# Patient Record
Sex: Female | Born: 1965 | Hispanic: No | Marital: Married | Smoking: Never smoker
Health system: Southern US, Community
[De-identification: ages and names within clinical notes are randomized; demographics above are authoritative.]

## PROBLEM LIST (undated history)

## (undated) DIAGNOSIS — D259 Leiomyoma of uterus, unspecified: Secondary | ICD-10-CM

## (undated) DIAGNOSIS — F32A Depression, unspecified: Secondary | ICD-10-CM

## (undated) DIAGNOSIS — G43909 Migraine, unspecified, not intractable, without status migrainosus: Secondary | ICD-10-CM

## (undated) DIAGNOSIS — J342 Deviated nasal septum: Secondary | ICD-10-CM

## (undated) DIAGNOSIS — C801 Malignant (primary) neoplasm, unspecified: Secondary | ICD-10-CM

## (undated) DIAGNOSIS — K219 Gastro-esophageal reflux disease without esophagitis: Secondary | ICD-10-CM

## (undated) DIAGNOSIS — F419 Anxiety disorder, unspecified: Secondary | ICD-10-CM

## (undated) DIAGNOSIS — N83209 Unspecified ovarian cyst, unspecified side: Secondary | ICD-10-CM

## (undated) DIAGNOSIS — I10 Essential (primary) hypertension: Secondary | ICD-10-CM

## (undated) DIAGNOSIS — E079 Disorder of thyroid, unspecified: Secondary | ICD-10-CM

## (undated) DIAGNOSIS — F329 Major depressive disorder, single episode, unspecified: Secondary | ICD-10-CM

## (undated) DIAGNOSIS — Z8619 Personal history of other infectious and parasitic diseases: Secondary | ICD-10-CM

## (undated) HISTORY — DX: Migraine, unspecified, not intractable, without status migrainosus: G43.909

## (undated) HISTORY — DX: Personal history of other infectious and parasitic diseases: Z86.19

## (undated) HISTORY — DX: Malignant (primary) neoplasm, unspecified: C80.1

## (undated) HISTORY — DX: Anxiety disorder, unspecified: F41.9

## (undated) HISTORY — DX: Essential (primary) hypertension: I10

## (undated) HISTORY — DX: Gastro-esophageal reflux disease without esophagitis: K21.9

## (undated) HISTORY — DX: Leiomyoma of uterus, unspecified: D25.9

## (undated) HISTORY — DX: Depression, unspecified: F32.A

## (undated) HISTORY — DX: Deviated nasal septum: J34.2

## (undated) HISTORY — DX: Disorder of thyroid, unspecified: E07.9

## (undated) HISTORY — DX: Unspecified ovarian cyst, unspecified side: N83.209

---
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
## 2007-09-26 HISTORY — PX: OTHER SURGICAL HISTORY: SHX169

## 2008-09-25 HISTORY — PX: THYROIDECTOMY: SHX17

## 2009-09-25 HISTORY — PX: OTHER SURGICAL HISTORY: SHX169

## 2017-12-24 LAB — HM COLONOSCOPY

## 2018-02-05 DIAGNOSIS — F411 Generalized anxiety disorder: Secondary | ICD-10-CM | POA: Diagnosis not present

## 2018-02-27 DIAGNOSIS — E039 Hypothyroidism, unspecified: Secondary | ICD-10-CM | POA: Diagnosis not present

## 2018-02-27 DIAGNOSIS — G43909 Migraine, unspecified, not intractable, without status migrainosus: Secondary | ICD-10-CM | POA: Diagnosis not present

## 2018-02-27 DIAGNOSIS — F329 Major depressive disorder, single episode, unspecified: Secondary | ICD-10-CM | POA: Diagnosis not present

## 2018-02-27 DIAGNOSIS — C3411 Malignant neoplasm of upper lobe, right bronchus or lung: Secondary | ICD-10-CM | POA: Diagnosis not present

## 2018-03-12 DIAGNOSIS — F411 Generalized anxiety disorder: Secondary | ICD-10-CM | POA: Diagnosis not present

## 2018-05-24 DIAGNOSIS — H9202 Otalgia, left ear: Secondary | ICD-10-CM | POA: Diagnosis not present

## 2018-05-24 DIAGNOSIS — R1310 Dysphagia, unspecified: Secondary | ICD-10-CM | POA: Diagnosis not present

## 2018-05-24 DIAGNOSIS — C3490 Malignant neoplasm of unspecified part of unspecified bronchus or lung: Secondary | ICD-10-CM | POA: Diagnosis not present

## 2018-05-24 DIAGNOSIS — Z85850 Personal history of malignant neoplasm of thyroid: Secondary | ICD-10-CM | POA: Diagnosis not present

## 2018-06-24 DIAGNOSIS — H9202 Otalgia, left ear: Secondary | ICD-10-CM | POA: Diagnosis not present

## 2018-08-06 DIAGNOSIS — Z85850 Personal history of malignant neoplasm of thyroid: Secondary | ICD-10-CM | POA: Diagnosis not present

## 2018-08-06 DIAGNOSIS — E890 Postprocedural hypothyroidism: Secondary | ICD-10-CM | POA: Diagnosis not present

## 2018-08-15 DIAGNOSIS — F4312 Post-traumatic stress disorder, chronic: Secondary | ICD-10-CM | POA: Diagnosis not present

## 2018-09-06 DIAGNOSIS — F4312 Post-traumatic stress disorder, chronic: Secondary | ICD-10-CM | POA: Diagnosis not present

## 2018-09-13 DIAGNOSIS — R911 Solitary pulmonary nodule: Secondary | ICD-10-CM | POA: Diagnosis not present

## 2018-10-04 DIAGNOSIS — F4312 Post-traumatic stress disorder, chronic: Secondary | ICD-10-CM | POA: Diagnosis not present

## 2018-10-09 DIAGNOSIS — E890 Postprocedural hypothyroidism: Secondary | ICD-10-CM | POA: Diagnosis not present

## 2018-10-09 DIAGNOSIS — Z2821 Immunization not carried out because of patient refusal: Secondary | ICD-10-CM | POA: Diagnosis not present

## 2018-10-09 DIAGNOSIS — D497 Neoplasm of unspecified behavior of endocrine glands and other parts of nervous system: Secondary | ICD-10-CM | POA: Diagnosis not present

## 2018-10-09 DIAGNOSIS — Z85850 Personal history of malignant neoplasm of thyroid: Secondary | ICD-10-CM | POA: Diagnosis not present

## 2018-10-11 DIAGNOSIS — F4312 Post-traumatic stress disorder, chronic: Secondary | ICD-10-CM | POA: Diagnosis not present

## 2018-10-15 DIAGNOSIS — Z0000 Encounter for general adult medical examination without abnormal findings: Secondary | ICD-10-CM | POA: Diagnosis not present

## 2018-10-25 DIAGNOSIS — F4312 Post-traumatic stress disorder, chronic: Secondary | ICD-10-CM | POA: Diagnosis not present

## 2018-11-04 DIAGNOSIS — F4312 Post-traumatic stress disorder, chronic: Secondary | ICD-10-CM | POA: Diagnosis not present

## 2018-11-11 DIAGNOSIS — Z85850 Personal history of malignant neoplasm of thyroid: Secondary | ICD-10-CM | POA: Diagnosis not present

## 2018-11-15 DIAGNOSIS — F4312 Post-traumatic stress disorder, chronic: Secondary | ICD-10-CM | POA: Diagnosis not present

## 2018-11-29 DIAGNOSIS — F4312 Post-traumatic stress disorder, chronic: Secondary | ICD-10-CM | POA: Diagnosis not present

## 2018-12-16 DIAGNOSIS — F4312 Post-traumatic stress disorder, chronic: Secondary | ICD-10-CM | POA: Diagnosis not present

## 2018-12-30 DIAGNOSIS — F4312 Post-traumatic stress disorder, chronic: Secondary | ICD-10-CM | POA: Diagnosis not present

## 2019-01-13 DIAGNOSIS — F4312 Post-traumatic stress disorder, chronic: Secondary | ICD-10-CM | POA: Diagnosis not present

## 2019-02-12 DIAGNOSIS — R197 Diarrhea, unspecified: Secondary | ICD-10-CM | POA: Diagnosis not present

## 2019-02-12 DIAGNOSIS — R1084 Generalized abdominal pain: Secondary | ICD-10-CM | POA: Diagnosis not present

## 2019-02-12 DIAGNOSIS — R112 Nausea with vomiting, unspecified: Secondary | ICD-10-CM | POA: Diagnosis not present

## 2019-02-12 DIAGNOSIS — Z630 Problems in relationship with spouse or partner: Secondary | ICD-10-CM | POA: Diagnosis not present

## 2019-02-21 DIAGNOSIS — E039 Hypothyroidism, unspecified: Secondary | ICD-10-CM | POA: Diagnosis not present

## 2019-02-21 DIAGNOSIS — E785 Hyperlipidemia, unspecified: Secondary | ICD-10-CM | POA: Diagnosis not present

## 2019-02-21 DIAGNOSIS — E559 Vitamin D deficiency, unspecified: Secondary | ICD-10-CM | POA: Diagnosis not present

## 2019-02-21 DIAGNOSIS — R740 Nonspecific elevation of levels of transaminase and lactic acid dehydrogenase [LDH]: Secondary | ICD-10-CM | POA: Diagnosis not present

## 2019-02-21 DIAGNOSIS — Z79899 Other long term (current) drug therapy: Secondary | ICD-10-CM | POA: Diagnosis not present

## 2019-02-21 DIAGNOSIS — R799 Abnormal finding of blood chemistry, unspecified: Secondary | ICD-10-CM | POA: Diagnosis not present

## 2019-04-04 DIAGNOSIS — R911 Solitary pulmonary nodule: Secondary | ICD-10-CM | POA: Diagnosis not present

## 2019-04-10 DIAGNOSIS — Z20828 Contact with and (suspected) exposure to other viral communicable diseases: Secondary | ICD-10-CM | POA: Diagnosis not present

## 2019-04-10 DIAGNOSIS — R05 Cough: Secondary | ICD-10-CM | POA: Diagnosis not present

## 2019-04-10 DIAGNOSIS — R0600 Dyspnea, unspecified: Secondary | ICD-10-CM | POA: Diagnosis not present

## 2019-04-10 DIAGNOSIS — Z87898 Personal history of other specified conditions: Secondary | ICD-10-CM | POA: Diagnosis not present

## 2019-05-12 DIAGNOSIS — M9903 Segmental and somatic dysfunction of lumbar region: Secondary | ICD-10-CM | POA: Diagnosis not present

## 2019-05-12 DIAGNOSIS — M545 Low back pain: Secondary | ICD-10-CM | POA: Diagnosis not present

## 2019-05-12 DIAGNOSIS — M9902 Segmental and somatic dysfunction of thoracic region: Secondary | ICD-10-CM | POA: Diagnosis not present

## 2019-05-12 DIAGNOSIS — M546 Pain in thoracic spine: Secondary | ICD-10-CM | POA: Diagnosis not present

## 2019-07-16 DIAGNOSIS — G43909 Migraine, unspecified, not intractable, without status migrainosus: Secondary | ICD-10-CM | POA: Diagnosis not present

## 2019-07-16 DIAGNOSIS — R1110 Vomiting, unspecified: Secondary | ICD-10-CM | POA: Diagnosis not present

## 2019-07-16 DIAGNOSIS — F17200 Nicotine dependence, unspecified, uncomplicated: Secondary | ICD-10-CM | POA: Diagnosis not present

## 2019-07-16 DIAGNOSIS — Z885 Allergy status to narcotic agent status: Secondary | ICD-10-CM | POA: Diagnosis not present

## 2019-07-16 DIAGNOSIS — Z79899 Other long term (current) drug therapy: Secondary | ICD-10-CM | POA: Diagnosis not present

## 2019-07-16 DIAGNOSIS — R197 Diarrhea, unspecified: Secondary | ICD-10-CM | POA: Diagnosis not present

## 2019-07-17 DIAGNOSIS — F419 Anxiety disorder, unspecified: Secondary | ICD-10-CM | POA: Diagnosis not present

## 2019-07-17 DIAGNOSIS — G43909 Migraine, unspecified, not intractable, without status migrainosus: Secondary | ICD-10-CM | POA: Diagnosis not present

## 2019-07-17 DIAGNOSIS — E039 Hypothyroidism, unspecified: Secondary | ICD-10-CM | POA: Diagnosis not present

## 2019-07-17 DIAGNOSIS — Z85118 Personal history of other malignant neoplasm of bronchus and lung: Secondary | ICD-10-CM | POA: Diagnosis not present

## 2019-07-31 DIAGNOSIS — M9901 Segmental and somatic dysfunction of cervical region: Secondary | ICD-10-CM | POA: Diagnosis not present

## 2019-07-31 DIAGNOSIS — M542 Cervicalgia: Secondary | ICD-10-CM | POA: Diagnosis not present

## 2019-07-31 DIAGNOSIS — M9902 Segmental and somatic dysfunction of thoracic region: Secondary | ICD-10-CM | POA: Diagnosis not present

## 2019-07-31 DIAGNOSIS — M546 Pain in thoracic spine: Secondary | ICD-10-CM | POA: Diagnosis not present

## 2019-09-29 DIAGNOSIS — F4312 Post-traumatic stress disorder, chronic: Secondary | ICD-10-CM | POA: Diagnosis not present

## 2019-10-06 DIAGNOSIS — R911 Solitary pulmonary nodule: Secondary | ICD-10-CM | POA: Diagnosis not present

## 2019-10-06 DIAGNOSIS — C73 Malignant neoplasm of thyroid gland: Secondary | ICD-10-CM | POA: Diagnosis not present

## 2019-10-06 DIAGNOSIS — Z7689 Persons encountering health services in other specified circumstances: Secondary | ICD-10-CM | POA: Diagnosis not present

## 2019-10-06 DIAGNOSIS — C3491 Malignant neoplasm of unspecified part of right bronchus or lung: Secondary | ICD-10-CM | POA: Diagnosis not present

## 2019-10-13 DIAGNOSIS — C3491 Malignant neoplasm of unspecified part of right bronchus or lung: Secondary | ICD-10-CM | POA: Insufficient documentation

## 2019-10-13 DIAGNOSIS — F4312 Post-traumatic stress disorder, chronic: Secondary | ICD-10-CM | POA: Diagnosis not present

## 2019-10-17 DIAGNOSIS — M9902 Segmental and somatic dysfunction of thoracic region: Secondary | ICD-10-CM | POA: Diagnosis not present

## 2019-10-17 DIAGNOSIS — M9901 Segmental and somatic dysfunction of cervical region: Secondary | ICD-10-CM | POA: Diagnosis not present

## 2019-10-17 DIAGNOSIS — M546 Pain in thoracic spine: Secondary | ICD-10-CM | POA: Diagnosis not present

## 2019-10-17 DIAGNOSIS — M542 Cervicalgia: Secondary | ICD-10-CM | POA: Diagnosis not present

## 2019-10-21 DIAGNOSIS — F4312 Post-traumatic stress disorder, chronic: Secondary | ICD-10-CM | POA: Diagnosis not present

## 2019-10-23 DIAGNOSIS — M9902 Segmental and somatic dysfunction of thoracic region: Secondary | ICD-10-CM | POA: Diagnosis not present

## 2019-10-23 DIAGNOSIS — M9901 Segmental and somatic dysfunction of cervical region: Secondary | ICD-10-CM | POA: Diagnosis not present

## 2019-10-23 DIAGNOSIS — M542 Cervicalgia: Secondary | ICD-10-CM | POA: Diagnosis not present

## 2019-10-23 DIAGNOSIS — M546 Pain in thoracic spine: Secondary | ICD-10-CM | POA: Diagnosis not present

## 2019-11-04 DIAGNOSIS — F4312 Post-traumatic stress disorder, chronic: Secondary | ICD-10-CM | POA: Diagnosis not present

## 2019-11-25 DIAGNOSIS — F4312 Post-traumatic stress disorder, chronic: Secondary | ICD-10-CM | POA: Diagnosis not present

## 2019-12-10 DIAGNOSIS — F4312 Post-traumatic stress disorder, chronic: Secondary | ICD-10-CM | POA: Diagnosis not present

## 2019-12-29 ENCOUNTER — Other Ambulatory Visit: Payer: Self-pay | Admitting: Internal Medicine

## 2019-12-29 ENCOUNTER — Encounter: Payer: Self-pay | Admitting: Internal Medicine

## 2019-12-29 ENCOUNTER — Other Ambulatory Visit: Payer: Self-pay

## 2019-12-29 ENCOUNTER — Ambulatory Visit: Payer: BC Managed Care – PPO | Admitting: Internal Medicine

## 2019-12-29 VITALS — BP 108/70 | HR 94 | Temp 98.1°F | Ht 68.5 in | Wt 151.0 lb

## 2019-12-29 DIAGNOSIS — N83201 Unspecified ovarian cyst, right side: Secondary | ICD-10-CM

## 2019-12-29 DIAGNOSIS — J342 Deviated nasal septum: Secondary | ICD-10-CM

## 2019-12-29 DIAGNOSIS — F321 Major depressive disorder, single episode, moderate: Secondary | ICD-10-CM | POA: Diagnosis not present

## 2019-12-29 DIAGNOSIS — G43009 Migraine without aura, not intractable, without status migrainosus: Secondary | ICD-10-CM | POA: Diagnosis not present

## 2019-12-29 DIAGNOSIS — Z85850 Personal history of malignant neoplasm of thyroid: Secondary | ICD-10-CM | POA: Diagnosis not present

## 2019-12-29 DIAGNOSIS — D259 Leiomyoma of uterus, unspecified: Secondary | ICD-10-CM

## 2019-12-29 DIAGNOSIS — F419 Anxiety disorder, unspecified: Secondary | ICD-10-CM | POA: Insufficient documentation

## 2019-12-29 DIAGNOSIS — M5020 Other cervical disc displacement, unspecified cervical region: Secondary | ICD-10-CM | POA: Insufficient documentation

## 2019-12-29 DIAGNOSIS — E559 Vitamin D deficiency, unspecified: Secondary | ICD-10-CM | POA: Diagnosis not present

## 2019-12-29 DIAGNOSIS — Z23 Encounter for immunization: Secondary | ICD-10-CM

## 2019-12-29 DIAGNOSIS — Z1231 Encounter for screening mammogram for malignant neoplasm of breast: Secondary | ICD-10-CM

## 2019-12-29 DIAGNOSIS — K760 Fatty (change of) liver, not elsewhere classified: Secondary | ICD-10-CM | POA: Diagnosis not present

## 2019-12-29 DIAGNOSIS — C3491 Malignant neoplasm of unspecified part of right bronchus or lung: Secondary | ICD-10-CM

## 2019-12-29 MED ORDER — LEVOTHYROXINE SODIUM 100 MCG PO TABS: 100.0000 ug | ORAL_TABLET | Freq: Every day | ORAL | 0 refills | Status: DC

## 2019-12-29 MED ORDER — FLUOXETINE HCL 20 MG PO CAPS: 20.0000 mg | ORAL_CAPSULE | Freq: Every day | ORAL | 0 refills | Status: DC

## 2019-12-29 NOTE — Patient Instructions (Signed)
Go to the imaging department to sign for your mammogram records from [place] - they will need these before you can schedule a mammogram

## 2019-12-29 NOTE — Progress Notes (Signed)
Date:  12/29/2019   Name:  Emma Ayers   DOB:  02-17-1966   MRN:  [id number]   Chief Complaint: Establish Care  Thyroid Problem Presents for follow-up (hx thyroid cancer s/p thyroidectomy and RAI tx -) visit. Symptoms include anxiety and fatigue. Patient reports no constipation, diarrhea, heat intolerance, leg swelling, menstrual problem, palpitations or weight gain. The symptoms have been stable.  Migraine  This is a recurrent problem. The problem occurs intermittently. The pain quality is similar to prior headaches. Associated symptoms include back pain, coughing (with yellow phlegm about once a day), insomnia and neck pain. Pertinent negatives include no dizziness or hearing loss. She has tried triptans for the symptoms. The treatment provided significant relief.  Depression        This is a chronic problem.  The problem occurs daily.  Associated symptoms include decreased concentration, fatigue, helplessness, hopelessness, insomnia, irritable, restlessness, decreased interest, appetite change, sad and suicidal ideas.  Associated symptoms include no headaches.  Past treatments include SSRIs - Selective serotonin reuptake inhibitors.  Compliance with treatment is good.  Previous treatment provided mild relief.  Past medical history includes thyroid problem.   Tobacco use - she smokes despite having been treated for presumed lung cancer in 2019.  She previously was prescribed medical marijuana in [place] but can not get that here. She has an intermittent cough with yellow phlegm about once a day.  No wheezing or SOB. GYN concerns - she has hx of uterine fibroid and right ovarian cyst.  She underwent uterine ablation around 2011.  Apparently the ovarian cyst is benign but she is due for GYN follow up. Cervical HNP and other spinal injury - from at [facility] in 2009.  She has been able to rehab with the assistance of TENS, traction, etc.  She has had ESI in her neck but no other spinal sites.  She feels  like she has regained about 90% of function. Vitamin D and CoQ-10 deficiencies - she has been tested in the past few years and was low.  She has been taking supplements regularly and would like to have her levels checked. Fibrocystic breast - she is due for a screening mammogram.  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  Instructions are provided. Shingrix - pt reports that she obtained the first Shingrix from the pharmacy.  She would like to get her second dose today before the dosing interval is exceeded. Hepatic steatosis - this has been documented on prior scans.  She does not consume alcohol but admits that she may eat more sweets than she should.  She has not had labs done recently for liver function tests.    Review of Systems  Constitutional: Positive for appetite change and fatigue. Negative for weight gain.  HENT: Positive for congestion (due to septal deviation) and postnasal drip. Negative for hearing loss, trouble swallowing and voice change.   Respiratory: Positive for cough (with yellow phlegm about once a day). Negative for shortness of breath and wheezing.   Cardiovascular: Negative for chest pain, palpitations and leg swelling.  Gastrointestinal: Negative for constipation and diarrhea.  Endocrine: Negative for heat intolerance.  Genitourinary: Negative for dysuria, hematuria, menstrual problem and pelvic pain.  Musculoskeletal: Positive for back pain and neck pain. Negative for gait problem.  Allergic/Immunologic: Positive for environmental allergies.  Neurological: Negative for dizziness, light-headedness and headaches.  Psychiatric/Behavioral: Positive for decreased concentration, depression and suicidal ideas. The patient is  nervous/anxious and has insomnia.     Patient Active Problem List   Diagnosis Date Noted  . History of thyroid cancer 12/29/2019  . Anxiety disorder 12/29/2019  . Lung nodule 12/29/2019  . Migraine without  aura, not intractable 12/29/2019  . Adenocarcinoma of right lung ([place]) 10/13/2019    Allergies  Allergen Reactions  . Bee Venom Anaphylaxis  . Benzocaine Nausea And Vomiting  . Codeine Nausea Only  . Latex Rash    Past Surgical History:  Procedure Laterality Date  . iv radiation  2018-2019  . radioactive iodine    . 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    Social History   Tobacco Use  . Smoking status: Never Smoker  . Smokeless tobacco: Never Used  Substance Use Topics  . Alcohol use: Yes    Alcohol/week: 1.0 standard drinks    Types: 1 Standard drinks or equivalent per week  . Drug use: Yes    Types: Marijuana     Medication list has been reviewed and updated.  Current Meds  Medication Sig  . Cholecalciferol 50 MCG (2000 UT) TABS Take by mouth.  Marland Kitchen FLUoxetine (PROZAC) 20 MG capsule Take by mouth.  . levothyroxine (SYNTHROID) 100 MCG tablet Take 100 mcg by mouth daily before breakfast. Unithyroid brand ONLY  . LORazepam (ATIVAN) 1 MG tablet Take 1 mg by mouth every 8 (eight) hours. 0.5-1 tablets as needed for anxiety  . Multiple Vitamin (MULTIVITAMIN) capsule Take 1 capsule by mouth daily.  . ondansetron (ZOFRAN) 8 MG tablet Take 4 mg by mouth every 8 (eight) hours as needed for nausea or vomiting.   . SUMAtriptan (IMITREX) 6 MG/0.5ML SOLN injection Inject 6 mg into the skin every 2 (two) hours as needed for migraine or headache. May repeat in 2 hours if headache persists or recurs.    PHQ 2/9 Scores 12/29/2019  PHQ - 2 Score 4  PHQ- 9 Score 18   GAD 7 : Generalized Anxiety Score 12/29/2019  Nervous, Anxious, on Edge 3  Control/stop worrying 2  Worry too much - different things 2  Trouble relaxing 3  Restless 2  Easily annoyed or irritable 2  Afraid - awful might happen 2  Total GAD 7 Score 16  Anxiety Difficulty Extremely difficult       BP Readings from Last 3 Encounters:  12/29/19 108/70    Physical Exam Vitals and nursing note reviewed.    Constitutional:      General: She is irritable. She is not in acute distress.    Appearance: Normal appearance. She is well-developed.  HENT:     Head: Normocephalic and atraumatic.  Neck:     Vascular: No carotid bruit.  Cardiovascular:     Rate and Rhythm: Normal rate and regular rhythm.     Pulses: Normal pulses.  Pulmonary:     Effort: Pulmonary effort is normal. No respiratory distress.     Breath sounds: No wheezing or rhonchi.  Musculoskeletal:     Cervical back: Normal range of motion. No tenderness.     Right lower leg: No edema.     Left lower leg: No edema.  Lymphadenopathy:     Cervical: No cervical adenopathy.  Skin:    General: Skin is warm and dry.     Capillary Refill: Capillary refill takes less than 2 seconds.     Findings: No rash.  Neurological:     General: No focal deficit present.     Mental Status: She  is alert and oriented to person, place, and time.  Psychiatric:        Attention and Perception: Attention normal.        Mood and Affect: Mood is depressed. Affect is not tearful or inappropriate.        Speech: Speech normal.        Behavior: Behavior normal.        Thought Content: Thought content does not include suicidal plan.     Wt Readings from Last 3 Encounters:  12/29/19 151 lb (68.5 kg)    BP 108/70   Pulse 94   Temp 98.1 F (36.7 C) (Oral)   Ht 5' 8.5" (1.74 m)   Wt 151 lb (68.5 kg)   SpO2 98%   BMI 22.63 kg/m   Assessment and Plan: 1. History of thyroid cancer Pt is s/p thyroidectomy and RAI She believes that she has had some tissue regrowth and needs referral to both Endocrine and ENT Will check labs today; refill levothyroxine She has also been taking thyroid supplement due to low T3 levels that is no longer on the market - she will discuss alternative with Endo - Ambulatory referral to Endocrinology - levothyroxine (SYNTHROID) 100 MCG tablet; Take 1 tablet (100 mcg total) by mouth daily before breakfast. Unithyroid brand  ONLY  Dispense: 90 tablet; Refill: 0 - TSH+T4F+T3Free - Thyroglobulin Level  2. Depression, major, single episode, moderate ([place]) Clinically very depressed due to her family and social situation.  She was compelled to move to  to be near her daughters who reside with her husband.  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  She has continues to take Prozac but also prefers to include homeopathic and alternative medicine strategies into her treatment plan. Will refill Prozac and refer to Psych - Ambulatory referral to Psychiatry - FLUoxetine (PROZAC) 20 MG capsule; Take 1 capsule (20 mg total) by mouth daily.  Dispense: 90 capsule; Refill: 0  3. Migraine without aura and without status migrainosus, not intractable These are well controlled on Imitrex and Zofran The frequency is affected by stress and also by recent extensive dental work  4. Hepatic steatosis Recommend healthy diet, avoidance of alcohol Consider hepatic elastography if labs are abnormal Would like to get prior records as well - Comprehensive metabolic panel  5. Cervical herniated disc Continue home management with TENS, traction and exercise  6. Uterine leiomyoma, unspecified location Needs to establish with GYN - Ambulatory referral to Obstetrics / Gynecology - CBC with Differential/Platelet  7. Ovarian cyst, right Needs to establish with GYN  8. Vitamin D deficiency Continue supplementation and check levels - VITAMIN D 25 Hydroxy (Vit-D Deficiency, Fractures) - Coenzyme Q10, Total  9. Nasal septal deviation Currently using Flonase? Spray for congestion with good results, however would like ENT to evaluate further - Ambulatory referral to ENT  10. Encounter for screening mammogram for breast cancer She will sign for previous records then schedule at [facility]; Future  11. Need for shingles vaccine Second dose given - Varicella-zoster vaccine  IM  12. Adenocarcinoma of right lung [facility]) She has already established with [facility] Recent scan demonstrated apparent stability of the lung findings She was disappointed that the imaging information was not forwarded to her specialist in [place] Currently asymptomatic other than occasional production of yellow phlegm   Partially dictated using [place]. Any errors are unintentional.  Halina Maidens, MD [facility]  12/29/2019  

## 2019-12-30 DIAGNOSIS — F4312 Post-traumatic stress disorder, chronic: Secondary | ICD-10-CM | POA: Diagnosis not present

## 2020-01-01 ENCOUNTER — Telehealth: Payer: Self-pay | Admitting: Certified Nurse Midwife

## 2020-01-05 LAB — CBC WITH DIFFERENTIAL/PLATELET
Basophils Absolute: 0 10*3/uL (ref 0.0–0.2)
Basos: 0 %
EOS (ABSOLUTE): 0.1 10*3/uL (ref 0.0–0.4)
Eos: 2 %
Hematocrit: 38.8 % (ref 34.0–46.6)
Hemoglobin: 13.4 g/dL (ref 11.1–15.9)
Immature Grans (Abs): 0 10*3/uL (ref 0.0–0.1)
Immature Granulocytes: 0 %
Lymphocytes Absolute: 1.6 10*3/uL (ref 0.7–3.1)
Lymphs: 34 %
MCH: 29.6 pg (ref 26.6–33.0)
MCHC: 34.5 g/dL (ref 31.5–35.7)
MCV: 86 fL (ref 79–97)
Monocytes Absolute: 0.5 10*3/uL (ref 0.1–0.9)
Monocytes: 10 %
Neutrophils Absolute: 2.6 10*3/uL (ref 1.4–7.0)
Neutrophils: 54 %
Platelets: 253 10*3/uL (ref 150–450)
RBC: 4.52 x10E6/uL (ref 3.77–5.28)
RDW: 13.3 % (ref 11.7–15.4)
WBC: 4.8 10*3/uL (ref 3.4–10.8)

## 2020-01-05 LAB — COMPREHENSIVE METABOLIC PANEL
ALT: 17 IU/L (ref 0–32)
AST: 21 IU/L (ref 0–40)
Albumin/Globulin Ratio: 1.8 (ref 1.2–2.2)
Albumin: 4.8 g/dL (ref 3.8–4.9)
Alkaline Phosphatase: 59 IU/L (ref 39–117)
BUN/Creatinine Ratio: 13 (ref 9–23)
BUN: 11 mg/dL (ref 6–24)
Bilirubin Total: 0.5 mg/dL (ref 0.0–1.2)
CO2: 24 mmol/L (ref 20–29)
Calcium: 9.6 mg/dL (ref 8.7–10.2)
Chloride: 103 mmol/L (ref 96–106)
Creatinine, Ser: 0.85 mg/dL (ref 0.57–1.00)
GFR calc Af Amer: 90 mL/min/{1.73_m2} (ref >59)
GFR calc non Af Amer: 78 mL/min/{1.73_m2} (ref >59)
Globulin, Total: 2.6 g/dL (ref 1.5–4.5)
Glucose: 89 mg/dL (ref 65–99)
Potassium: 4.1 mmol/L (ref 3.5–5.2)
Sodium: 142 mmol/L (ref 134–144)
Total Protein: 7.4 g/dL (ref 6.0–8.5)

## 2020-01-05 LAB — TSH+T4F+T3FREE
Free T4: 1.49 ng/dL (ref 0.82–1.77)
T3, Free: 2.3 pg/mL (ref 2.0–4.4)
TSH: 4.77 u[IU]/mL — ABNORMAL HIGH (ref 0.450–4.500)

## 2020-01-05 LAB — COENZYME Q10, TOTAL: Coenzyme Q10, Total: 0.75 ug/mL (ref 0.37–2.20)

## 2020-01-05 LAB — THYROGLOBULIN LEVEL: Thyroglobulin (TG-RIA): <2 ng/mL

## 2020-01-05 LAB — VITAMIN D 25 HYDROXY (VIT D DEFICIENCY, FRACTURES): Vit D, 25-Hydroxy: 45.2 ng/mL (ref 30.0–100.0)

## 2020-01-06 ENCOUNTER — Encounter: Payer: Self-pay | Admitting: Internal Medicine

## 2020-01-20 ENCOUNTER — Ambulatory Visit
Admission: RE | Admit: 2020-01-20 | Discharge: 2020-01-20 | Disposition: A | Discharge status: Home / Self Care | Payer: BC Managed Care – PPO | Source: Ambulatory Visit | Attending: Internal Medicine | Admitting: Internal Medicine

## 2020-01-20 DIAGNOSIS — F4312 Post-traumatic stress disorder, chronic: Secondary | ICD-10-CM | POA: Diagnosis not present

## 2020-01-20 DIAGNOSIS — Z1231 Encounter for screening mammogram for malignant neoplasm of breast: Secondary | ICD-10-CM | POA: Diagnosis not present

## 2020-01-27 ENCOUNTER — Other Ambulatory Visit: Payer: Self-pay

## 2020-01-27 ENCOUNTER — Other Ambulatory Visit (HOSPITAL_COMMUNITY)
Admission: RE | Admit: 2020-01-27 | Discharge: 2020-01-27 | Disposition: A | Discharge status: Home / Self Care | Payer: BC Managed Care – PPO | Source: Ambulatory Visit | Attending: Certified Nurse Midwife | Admitting: Certified Nurse Midwife

## 2020-01-27 ENCOUNTER — Encounter: Payer: Self-pay | Admitting: Certified Nurse Midwife

## 2020-01-27 ENCOUNTER — Ambulatory Visit: Payer: BC Managed Care – PPO | Admitting: Certified Nurse Midwife

## 2020-01-27 VITALS — BP 98/61 | HR 60 | Ht 68.0 in | Wt 156.2 lb

## 2020-01-27 DIAGNOSIS — Z23 Encounter for immunization: Secondary | ICD-10-CM | POA: Diagnosis not present

## 2020-01-27 DIAGNOSIS — Z136 Encounter for screening for cardiovascular disorders: Secondary | ICD-10-CM | POA: Diagnosis not present

## 2020-01-27 DIAGNOSIS — Z124 Encounter for screening for malignant neoplasm of cervix: Secondary | ICD-10-CM

## 2020-01-27 DIAGNOSIS — Z01419 Encounter for gynecological examination (general) (routine) without abnormal findings: Secondary | ICD-10-CM | POA: Diagnosis not present

## 2020-01-27 DIAGNOSIS — Z13220 Encounter for screening for lipoid disorders: Secondary | ICD-10-CM

## 2020-01-27 MED ORDER — TETANUS-DIPHTH-ACELL PERTUSSIS 5-2.5-18.5 LF-MCG/0.5 IM SUSP
0.5000 mL | Freq: Once | INTRAMUSCULAR | Status: AC
  Administered 2020-01-27: 0.5 mL via INTRAMUSCULAR

## 2020-01-27 NOTE — Progress Notes (Signed)
GYNECOLOGY ANNUAL PREVENTATIVE CARE ENCOUNTER NOTE  History:     Emma Ayers is a 54 y.o. G8P2002 female here for a routine annual gynecologic exam.  Current complaints: none.   Denies abnormal vaginal bleeding, discharge, pelvic pain, problems with intercourse or other gynecologic concerns.     Social separated with 2 children  Living: alone Work: 2 days a week message therapist  Exercise: daily 30- 1 hr Drug/alcohol/smoke: MJ , occasional alcohol, no smoking   Gynecologic History No LMP recorded. Patient has had an ablation. Contraception: none and ablation  Last Pap: 5 yrs ago per pt. Results were: normal  Last mammogram: 01/20/20. Results were: normal  Obstetric History OB History  Gravida Para Term Preterm AB Living  2 2 2     2   SAB TAB Ectopic Multiple Live Births          2    # Outcome Date GA Lbr Len/2nd Weight Sex Delivery Anes PTL Lv  2 Term 12/08/04   8 lb 7 oz (3.827 kg) F Vag-Spont  N LIV  1 Term 01/27/99   9 lb 3 oz (4.167 kg) F Vag-Spont  N LIV    Past Medical History:  Diagnosis Date  . Anxiety   . Cancer ([place])    thyroid and lung  . Depression   . Deviated septum   . GERD (gastroesophageal reflux disease)   . H/o Lyme disease    X2  . History of West Nile virus (WNV) infection   . Hypertension   . Migraine   . Ovarian cyst   . Thyroid disease   . Uterine fibroid     Past Surgical History:  Procedure Laterality Date  . iv radiation  2018-2019  . radioactive iodine    . 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 uterine ablation  2011    Current Outpatient Medications on File Prior to Visit  Medication Sig Dispense Refill  . Cholecalciferol 50 MCG (2000 UT) TABS Take by mouth.    . Coenzyme Q10 (COQ10) 400 MG CAPS Take by mouth daily.    Marland Kitchen FLUoxetine (PROZAC) 20 MG capsule Take 1 capsule (20 mg total) by mouth daily. 90 capsule 0  . levothyroxine (SYNTHROID) 100 MCG tablet Take 1 tablet (100 mcg total) by mouth daily  before breakfast. Unithyroid brand ONLY 90 tablet 0  . Multiple Vitamin (MULTIVITAMIN) capsule Take 1 capsule by mouth daily.    . SUMAtriptan (IMITREX) 6 MG/0.5ML SOLN injection Inject 6 mg into the skin every 2 (two) hours as needed for migraine or headache. May repeat in 2 hours if headache persists or recurs.    Marland Kitchen LORazepam (ATIVAN) 1 MG tablet Take 1 mg by mouth every 8 (eight) hours. 0.5-1 tablets as needed for anxiety    . ondansetron (ZOFRAN) 8 MG tablet Take 4 mg by mouth every 8 (eight) hours as needed for nausea or vomiting.      No current facility-administered medications on file prior to visit.    Allergies  Allergen Reactions  . Bee Venom Anaphylaxis  . Benzocaine Nausea And Vomiting  . Codeine Nausea Only  . Latex Rash    Social History:  reports that she has never smoked. She has never used smokeless tobacco. She reports current alcohol use of about 1.0 standard drinks of alcohol per week. She reports current drug use. Drug: Marijuana.  Family History  Problem Relation Age of Onset  . Heart failure Mother   .  Breast cancer Mother 56  . Heart disease Mother   . Depression Mother   . Heart attack Father   . Prostate cancer Father   . Anxiety disorder Father   . Heart disease Father   . Drug abuse Sister   . Colon polyps Sister   . Depression Sister   . Hypothyroidism Sister   . Heart disease Brother   . Depression Brother   . Hypothyroidism Brother   . Breast cancer Maternal Aunt   . Breast cancer Maternal Aunt     The following portions of the patient's history were reviewed and updated as appropriate: allergies, current medications, past family history, past medical history, past social history, past surgical history and problem list.  Review of Systems Pertinent items noted in HPI and remainder of comprehensive ROS otherwise negative.  Physical Exam:  BP 98/61   Pulse 60   Ht 5\' 8"  (1.727 m)   Wt 156 lb 4 oz (70.9 kg)   BMI 23.76 kg/m    CONSTITUTIONAL: Well-developed, well-nourished female in no acute distress.  HENT:  Normocephalic, atraumatic, External right and left ear normal. Oropharynx is clear and moist EYES: Conjunctivae and EOM are normal. Pupils are equal, round, and reactive to light. No scleral icterus.  NECK: Normal range of motion, supple, no masses.  Thyroid absent  SKIN: Skin is warm and dry. No rash noted. Not diaphoretic. No erythema. No pallor. MUSCULOSKELETAL: Normal range of motion. No tenderness.  No cyanosis, clubbing, or edema.  2+ distal pulses. NEUROLOGIC: Alert and oriented to person, place, and time. Normal reflexes, muscle tone coordination.  PSYCHIATRIC: Normal mood and affect. Normal behavior. Normal judgment and thought content. CARDIOVASCULAR: Normal heart rate noted, regular rhythm RESPIRATORY: Clear to auscultation bilaterally. Effort and breath sounds normal, no problems with respiration noted. BREASTS: Symmetric in size. No masses, tenderness, skin changes, nipple drainage, or lymphadenopathy bilaterally. Performed in the presence of a chaperone. ABDOMEN: Soft, no distention noted.  No tenderness, rebound or guarding.  PELVIC: Normal appearing external genitalia and urethral meatus; normal appearing vaginal mucosa and cervix.  No abnormal discharge noted.  Pap smear obtained.  Normal uterine size, no other palpable masses, no uterine or adnexal tenderness.  Performed in the presence of a chaperone.   Assessment and Plan:    1. Need for immunization with diphtheria, tetanus, and poliovirus vaccine   2. Women's annual routine gynecological examination Will follow up results of pap smear and manage accordingly. Mammogram completed Colonoscopy: done 3 yrs ago, WNL  Labs: lipid profile, FSH, Estradial  Orders: none  Referral : none  Routine preventative health maintenance measures emphasized. Please refer to After Visit Summary for other counseling recommendations.      Philip Aspen,  CNM

## 2020-01-27 NOTE — Patient Instructions (Signed)

## 2020-01-28 ENCOUNTER — Telehealth: Payer: Self-pay

## 2020-01-28 LAB — LIPID PANEL
Chol/HDL Ratio: 2.2 ratio (ref 0.0–4.4)
Cholesterol, Total: 156 mg/dL (ref 100–199)
HDL: 71 mg/dL (ref >39)
LDL Chol Calc (NIH): 71 mg/dL (ref 0–99)
Triglycerides: 74 mg/dL (ref 0–149)
VLDL Cholesterol Cal: 14 mg/dL (ref 5–40)

## 2020-01-28 LAB — ESTRADIOL: Estradiol: 19.8 pg/mL

## 2020-01-28 LAB — FOLLICLE STIMULATING HORMONE: FSH: 152 m[IU]/mL

## 2020-01-28 NOTE — Telephone Encounter (Signed)
Voicemail message left for patient- per Philip Aspen CNM labs normal- suggestive of menopause.

## 2020-02-03 ENCOUNTER — Telehealth: Payer: Self-pay

## 2020-02-03 LAB — CYTOLOGY - PAP
Comment: NEGATIVE
Diagnosis: NEGATIVE
High risk HPV: NEGATIVE

## 2020-02-03 NOTE — Telephone Encounter (Signed)
Voicemail message left for patient to call office for negative test results per Philip Aspen CNM

## 2020-02-06 ENCOUNTER — Telehealth: Payer: Self-pay | Admitting: Internal Medicine

## 2020-02-06 NOTE — Telephone Encounter (Signed)
Copied from [place] [id number]. Topic: General - Other >> Feb 06, 2020  9:52 AM Antonieta Iba C wrote: Reason for CRM: pt called in to speak with Dr. Gaspar Cola assistant. Pt wouldn't give me details, she is requesting a call back at: [phone]

## 2020-02-06 NOTE — Telephone Encounter (Signed)
Called and was told by the person that answered the phone that the patient is "busy and she will call back when she can."  Please get more details from pt if able.   CM

## 2020-02-10 DIAGNOSIS — F4312 Post-traumatic stress disorder, chronic: Secondary | ICD-10-CM | POA: Diagnosis not present

## 2020-02-17 DIAGNOSIS — Z85850 Personal history of malignant neoplasm of thyroid: Secondary | ICD-10-CM | POA: Diagnosis not present

## 2020-02-17 DIAGNOSIS — E890 Postprocedural hypothyroidism: Secondary | ICD-10-CM | POA: Diagnosis not present

## 2020-02-25 ENCOUNTER — Telehealth: Payer: BC Managed Care – PPO | Admitting: Internal Medicine

## 2020-02-25 ENCOUNTER — Telehealth: Payer: Self-pay

## 2020-02-25 NOTE — Telephone Encounter (Signed)
Called patient after realizing she was on the schedule for a phone visit. Explained the [facility] made this visit wrong and we do not do migraines over the phone. Explained we only do sick visits with fevers to prevent exposure to other patients.  She stated that she can just speak with Dr Army Melia over the phone today and that she others things she needs to discuss with her- and then she can follow up at a in person visit later.  Explained this is not how things work. Apologized that our new call center made this appt incorrectly, but told her that she needs to be seen this afternoon. Offered her a in person visit this afternoon. She declined saying that "It sounds like this is YOUR problem and not mine."  Told her I'm sorry again but if she does not have a fever then she needs to come into the office to discuss her problems with Dr. Army Melia. She then said it sounds like our protocols are made for [place] and not the patients. She is coming off of a migraine and is upset that Dr. Army Melia is refusing a VV.   She wants to speak to the office manager about this- Gave her office managers name and told her she will call her back this afternoon.  CM  Routing to office manager for review.

## 2020-02-26 ENCOUNTER — Ambulatory Visit (INDEPENDENT_AMBULATORY_CARE_PROVIDER_SITE_OTHER): Payer: BC Managed Care – PPO | Admitting: Internal Medicine

## 2020-02-26 ENCOUNTER — Encounter: Payer: Self-pay | Admitting: Internal Medicine

## 2020-02-26 ENCOUNTER — Other Ambulatory Visit: Payer: Self-pay

## 2020-02-26 VITALS — BP 104/64 | HR 80 | Temp 98.1°F | Ht 68.0 in | Wt 153.0 lb

## 2020-02-26 DIAGNOSIS — M5020 Other cervical disc displacement, unspecified cervical region: Secondary | ICD-10-CM | POA: Diagnosis not present

## 2020-02-26 DIAGNOSIS — F321 Major depressive disorder, single episode, moderate: Secondary | ICD-10-CM

## 2020-02-26 DIAGNOSIS — G43009 Migraine without aura, not intractable, without status migrainosus: Secondary | ICD-10-CM | POA: Diagnosis not present

## 2020-02-26 DIAGNOSIS — W57XXXA Bitten or stung by nonvenomous insect and other nonvenomous arthropods, initial encounter: Secondary | ICD-10-CM

## 2020-02-26 DIAGNOSIS — Z85850 Personal history of malignant neoplasm of thyroid: Secondary | ICD-10-CM

## 2020-02-26 DIAGNOSIS — K760 Fatty (change of) liver, not elsewhere classified: Secondary | ICD-10-CM

## 2020-02-26 DIAGNOSIS — R6889 Other general symptoms and signs: Secondary | ICD-10-CM

## 2020-02-26 MED ORDER — ONDANSETRON HCL 8 MG PO TABS: 4.0000 mg | ORAL_TABLET | Freq: Three times a day (TID) | ORAL | 0 refills | Status: DC | PRN

## 2020-02-26 MED ORDER — LORAZEPAM 1 MG PO TABS: 1.0000 mg | ORAL_TABLET | Freq: Three times a day (TID) | ORAL | 0 refills | Status: DC

## 2020-02-26 NOTE — Progress Notes (Signed)
Date:  02/26/2020   Name:  Emma Ayers   DOB:  1965/09/29   MRN:  [id number]   Chief Complaint: Migraine (X2 days, virtigo, nasuea, pain going up the side of head ), Tick Removal (x2-3 weeks 2 tick bites, one right groin around and back of right knee, embedded in skin dont know how long they been there. she thinks she got then off within the first 24 hours. one was full of blood ), and Hypertension (X2-3 months trending high bp, bring it down with breathing, says she can hear a "pulse in her head")  Migraine  This is a recurrent problem. The problem has been unchanged. The pain does not radiate. Associated symptoms include dizziness, nausea and neck pain. Pertinent negatives include no abdominal pain, coughing, fever or vomiting. She has tried triptans for the symptoms.  She keeps a HA diary - has only had 2 migraines this year, the second one was earlier this week.  She generally had vertigo and nausea and they are triggered by stress, chronic neck pain and right sided muscle spasm.  She takes lorazepam to induce sleep and zofran for nausea.  She is concerned that any given HA might escalate with a requirement to go to the ED which she wants to avoid.  She has consulted Neuro in the past and they recommended Topamax if migraines were increasing.  For now , she is content to continue her current regimen and to only use imitrex injectable for very severe headaches.  Tick bites - had 2 tick bites in the past 2 weeks.  One on right knee and in the groin.  She never developed sx of tick fever - no rash, fever, persistent headache, target lesion.  She reports having had Lyme disease twice in the past.  She is familiar with the symptoms and treated herself conservatively with herbal agents.  The tick bites have essentially resolved.  Cervical HNP - she has had MRI documentation of the disc disease but has avoided surgery. She has traction and TENS units at home.  In the past, did well with PT manual  work and massage.  She would like to establish with a PT locally but does not feel that she needs Orthopedics or Neurosurgery consultation at this time.  Depression - she continues to take Prozac.  She was referred to Psych but they required $200 up front and would not take it at the time of her appointment.  She decided not to be seen there.  She relates that overall she is better - her main stress being the situation with her husband and that has improved now that she has a Chief Executive Officer.  She wants to defer psychiatry evaluation at this time and continue to see her counselor, along with Prozac.  Lab Results  Component Value Date   CREATININE 0.85 12/29/2019   BUN 11 12/29/2019   NA 142 12/29/2019   K 4.1 12/29/2019   CL 103 12/29/2019   CO2 24 12/29/2019   Lab Results  Component Value Date   CHOL 156 01/27/2020   HDL 71 01/27/2020   LDLCALC 71 01/27/2020   TRIG 74 01/27/2020   CHOLHDL 2.2 01/27/2020   Lab Results  Component Value Date   TSH 4.770 (H) 12/29/2019   No results found for: HGBA1C Lab Results  Component Value Date   WBC 4.8 12/29/2019   HGB 13.4 12/29/2019   HCT 38.8 12/29/2019   MCV 86 12/29/2019   PLT 253 12/29/2019  Lab Results  Component Value Date   ALT 17 12/29/2019   AST 21 12/29/2019   ALKPHOS 59 12/29/2019   BILITOT 0.5 12/29/2019     Review of Systems  Constitutional: Negative for chills, fatigue and fever.  Respiratory: Negative for cough, chest tightness and shortness of breath.   Cardiovascular: Negative for chest pain, palpitations and leg swelling.       Some concern that her BP is higher - using a home wrist cuff readings often 135/95 in the AM and lower in the PM  Gastrointestinal: Positive for nausea. Negative for abdominal pain, constipation and vomiting.  Musculoskeletal: Positive for arthralgias, neck pain and neck stiffness.  Neurological: Positive for dizziness and headaches.  Psychiatric/Behavioral: Negative for dysphoric mood and  sleep disturbance. The patient is not nervous/anxious.     Patient Active Problem List   Diagnosis Date Noted  . History of thyroid cancer 12/29/2019  . Anxiety disorder 12/29/2019  . Migraine without aura, not intractable 12/29/2019  . Cervical herniated disc 12/29/2019  . Uterine fibroid 12/29/2019  . Depression, major, single episode, moderate ([place]) 12/29/2019  . Vitamin D deficiency 12/29/2019  . Nasal septal deviation 12/29/2019  . Hepatic steatosis 12/29/2019  . Ovarian cyst, right 12/29/2019  . Adenocarcinoma of right lung ([place]) 10/13/2019    Allergies  Allergen Reactions  . Bee Venom Anaphylaxis  . Benzocaine Nausea And Vomiting  . Codeine Nausea Only  . Latex Rash    Past Surgical History:  Procedure Laterality Date  . iv radiation  2018-2019  . radioactive iodine    . 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 uterine ablation  2011    Social History   Tobacco Use  . Smoking status: Never Smoker  . Smokeless tobacco: Never Used  Substance Use Topics  . Alcohol use: Yes    Alcohol/week: 1.0 standard drinks    Types: 1 Standard drinks or equivalent per week  . Drug use: Yes    Frequency: 7.0 times per week    Types: Marijuana    Comment: sometimes at bedtime     Medication list has been reviewed and updated.  Current Meds  Medication Sig  . Cholecalciferol 50 MCG (2000 UT) TABS Take by mouth. As needed  . Coenzyme Q10 (COQ10) 400 MG CAPS Take by mouth daily. As needed  . FLUoxetine (PROZAC) 20 MG capsule Take 1 capsule (20 mg total) by mouth daily.  Marland Kitchen levOCARNitine (L-CARNITINE) 250 MG TABS Take by mouth.  . levothyroxine (UNITHROID) 100 MCG tablet Take 100 mcg by mouth.  Marland Kitchen LORazepam (ATIVAN) 1 MG tablet Take 1 tablet (1 mg total) by mouth every 8 (eight) hours. 0.5-1 tablets as needed for anxiety  . Multiple Vitamin (MULTIVITAMIN) capsule Take 1 capsule by mouth daily.  . ondansetron (ZOFRAN) 8 MG tablet Take 0.5 tablets (4 mg total) by  mouth every 8 (eight) hours as needed for nausea or vomiting.  . Pseudoephedrine-Ibuprofen (ADVIL COLD/SINUS PO) Take 1 tablet by mouth. As needed  . SUMAtriptan (IMITREX) 6 MG/0.5ML SOLN injection Inject 6 mg into the skin every 2 (two) hours as needed for migraine or headache. May repeat in 2 hours if headache persists or recurs.  . [DISCONTINUED] LORazepam (ATIVAN) 1 MG tablet Take 1 mg by mouth every 8 (eight) hours. 0.5-1 tablets as needed for anxiety  . [DISCONTINUED] ondansetron (ZOFRAN) 8 MG tablet Take 4 mg by mouth every 8 (eight) hours as needed for nausea or vomiting.  PHQ 2/9 Scores 02/26/2020 12/29/2019  PHQ - 2 Score 0 4  PHQ- 9 Score 0 18    BP Readings from Last 3 Encounters:  02/26/20 104/64  01/27/20 98/61  12/29/19 108/70    Physical Exam Vitals and nursing note reviewed.  Constitutional:      General: She is not in acute distress.    Appearance: Normal appearance. She is well-developed.  HENT:     Head: Normocephalic and atraumatic.  Cardiovascular:     Rate and Rhythm: Normal rate and regular rhythm.     Pulses: Normal pulses.  Pulmonary:     Effort: Pulmonary effort is normal. No respiratory distress.     Breath sounds: No wheezing or rhonchi.  Abdominal:     General: Abdomen is flat.     Palpations: Abdomen is soft.     Tenderness: There is no guarding or rebound.  Musculoskeletal:     Right lower leg: No edema.     Left lower leg: No edema.  Lymphadenopathy:     Cervical: No cervical adenopathy.     Lower Body: No right inguinal adenopathy. No left inguinal adenopathy.  Skin:    General: Skin is warm and dry.     Capillary Refill: Capillary refill takes less than 2 seconds.     Findings: No rash.  Neurological:     General: No focal deficit present.     Mental Status: She is alert and oriented to person, place, and time.     Gait: Gait normal.  Psychiatric:        Attention and Perception: Attention normal.        Mood and Affect: Mood is  anxious.        Speech: Speech normal.     Wt Readings from Last 3 Encounters:  02/26/20 153 lb (69.4 kg)  01/27/20 156 lb 4 oz (70.9 kg)  12/29/19 151 lb (68.5 kg)    BP 104/64   Pulse 80   Temp 98.1 F (36.7 C) (Oral)   Ht 5\' 8"  (1.727 m)   Wt 153 lb (69.4 kg)   SpO2 97%   BMI 23.26 kg/m   Assessment and Plan: 1. Migraine without aura and without status migrainosus, not intractable Continue current abortive tx with lorazepam and zofran Imitrex inj if needed Consider Roselyn Meier - currently migraines are only intermittent No daily preventative is indicated at this time Sport and exercise psychologist suggested Topamax) - LORazepam (ATIVAN) 1 MG tablet; Take 1 tablet (1 mg total) by mouth every 8 (eight) hours. 0.5-1 tablets as needed for anxiety  Dispense: 30 tablet; Refill: 0 - ondansetron (ZOFRAN) 8 MG tablet; Take 0.5 tablets (4 mg total) by mouth every 8 (eight) hours as needed for nausea or vomiting.  Dispense: 30 tablet; Refill: 0  2. Tick bite, initial encounter Both tick bite sites appear completely benign No evidence of tick disease by recent history or exam  3. Cervical herniated disc Continue home treatments with TENS and traction Will refer to PT for adjunctive hands on methods to reduce pain and stress - Ambulatory referral to Physical Therapy  4. Depression, major, single episode, moderate (HCC) Clinically stable on current regimen with good control of symptoms, No SI or HI. Will continue current therapy. PHQ9 = 0 today Hold off on referral to Psych at this time  5. Blood pressure alteration BP today is on the low side.  Recent values at Endocrinology were normal. Recommend continuing to monitor at home and return with cuff if  persistently > 140/90 for further evaluation   Partially dictated using Editor, commissioning. Any errors are unintentional.  Halina Maidens, MD [facility]  02/26/2020

## 2020-03-02 DIAGNOSIS — M546 Pain in thoracic spine: Secondary | ICD-10-CM | POA: Diagnosis not present

## 2020-03-02 DIAGNOSIS — G4489 Other headache syndrome: Secondary | ICD-10-CM | POA: Diagnosis not present

## 2020-03-02 DIAGNOSIS — M542 Cervicalgia: Secondary | ICD-10-CM | POA: Diagnosis not present

## 2020-03-04 DIAGNOSIS — M546 Pain in thoracic spine: Secondary | ICD-10-CM | POA: Diagnosis not present

## 2020-03-04 DIAGNOSIS — M542 Cervicalgia: Secondary | ICD-10-CM | POA: Diagnosis not present

## 2020-03-04 DIAGNOSIS — G4489 Other headache syndrome: Secondary | ICD-10-CM | POA: Diagnosis not present

## 2020-03-09 DIAGNOSIS — F4312 Post-traumatic stress disorder, chronic: Secondary | ICD-10-CM | POA: Diagnosis not present

## 2020-03-11 DIAGNOSIS — M542 Cervicalgia: Secondary | ICD-10-CM | POA: Diagnosis not present

## 2020-03-11 DIAGNOSIS — G4489 Other headache syndrome: Secondary | ICD-10-CM | POA: Diagnosis not present

## 2020-03-18 DIAGNOSIS — M542 Cervicalgia: Secondary | ICD-10-CM | POA: Diagnosis not present

## 2020-03-18 DIAGNOSIS — G4489 Other headache syndrome: Secondary | ICD-10-CM | POA: Diagnosis not present

## 2020-03-22 DIAGNOSIS — R911 Solitary pulmonary nodule: Secondary | ICD-10-CM | POA: Diagnosis not present

## 2020-03-22 DIAGNOSIS — C3491 Malignant neoplasm of unspecified part of right bronchus or lung: Secondary | ICD-10-CM | POA: Diagnosis not present

## 2020-04-07 DIAGNOSIS — F4312 Post-traumatic stress disorder, chronic: Secondary | ICD-10-CM | POA: Diagnosis not present

## 2020-04-24 ENCOUNTER — Other Ambulatory Visit: Payer: Self-pay | Admitting: Internal Medicine

## 2020-04-28 DIAGNOSIS — F4312 Post-traumatic stress disorder, chronic: Secondary | ICD-10-CM | POA: Diagnosis not present

## 2020-05-13 DIAGNOSIS — G4489 Other headache syndrome: Secondary | ICD-10-CM | POA: Diagnosis not present

## 2020-05-13 DIAGNOSIS — M542 Cervicalgia: Secondary | ICD-10-CM | POA: Diagnosis not present

## 2020-05-21 ENCOUNTER — Encounter: Payer: Self-pay | Admitting: Internal Medicine

## 2020-05-21 ENCOUNTER — Other Ambulatory Visit: Payer: Self-pay

## 2020-05-21 ENCOUNTER — Ambulatory Visit: Payer: BC Managed Care – PPO | Admitting: Internal Medicine

## 2020-05-21 VITALS — BP 96/64 | HR 61 | Ht 68.0 in | Wt 160.0 lb

## 2020-05-21 DIAGNOSIS — L247 Irritant contact dermatitis due to plants, except food: Secondary | ICD-10-CM | POA: Diagnosis not present

## 2020-05-21 MED ORDER — PREDNISONE 10 MG PO TABS: 10.0000 mg | ORAL_TABLET | ORAL | 0 refills | Status: AC

## 2020-05-21 NOTE — Progress Notes (Signed)
Date:  05/21/2020   Name:  Emma Ayers   DOB:  [birth year]   MRN:  [id number]   Chief Complaint: Rash (Itchy rash. Been working in her yard. Been on prednisone- left over from when she went to [facility] for her tooth. Started 5 days ago. )  Rash This is a new problem. The current episode started in the past 7 days. The problem is unchanged. The affected locations include the right hand, right arm, left hand and left arm. The rash is characterized by blistering, redness and itchiness. She was exposed to plant contact. Pertinent negatives include no fever or shortness of breath. Past treatments include oral steroids. The treatment provided mild relief.    Lab Results  Component Value Date   CREATININE 0.85 12/29/2019   BUN 11 12/29/2019   NA 142 12/29/2019   K 4.1 12/29/2019   CL 103 12/29/2019   CO2 24 12/29/2019   Lab Results  Component Value Date   CHOL 156 01/27/2020   HDL 71 01/27/2020   LDLCALC 71 01/27/2020   TRIG 74 01/27/2020   CHOLHDL 2.2 01/27/2020   Lab Results  Component Value Date   TSH 4.770 (H) 12/29/2019   No results found for: HGBA1C Lab Results  Component Value Date   WBC 4.8 12/29/2019   HGB 13.4 12/29/2019   HCT 38.8 12/29/2019   MCV 86 12/29/2019   PLT 253 12/29/2019   Lab Results  Component Value Date   ALT 17 12/29/2019   AST 21 12/29/2019   ALKPHOS 59 12/29/2019   BILITOT 0.5 12/29/2019     Review of Systems  Constitutional: Negative for chills and fever.  HENT: Negative for trouble swallowing.   Respiratory: Negative for chest tightness and shortness of breath.   Cardiovascular: Negative for chest pain and palpitations.  Skin: Positive for rash.  Neurological: Negative for dizziness and headaches.    Patient Active Problem List   Diagnosis Date Noted  . History of thyroid cancer 12/29/2019  . Anxiety disorder 12/29/2019  . Migraine without aura, not intractable 12/29/2019  . Cervical herniated disc 12/29/2019  . Uterine fibroid  12/29/2019  . Depression, major, single episode, moderate ([place]) 12/29/2019  . Vitamin D deficiency 12/29/2019  . Nasal septal deviation 12/29/2019  . Hepatic steatosis 12/29/2019  . Ovarian cyst, right 12/29/2019  . Adenocarcinoma of right lung ([place]) 10/13/2019    Allergies  Allergen Reactions  . Bee Venom Anaphylaxis  . Benzocaine Nausea And Vomiting  . Codeine Nausea Only  . Latex Rash    Past Surgical History:  Procedure Laterality Date  . iv radiation  2018-2019  . radioactive iodine    . 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 uterine ablation  2011    Social History   Tobacco Use  . Smoking status: Never Smoker  . Smokeless tobacco: Never Used  Vaping Use  . Vaping Use: Never used  Substance Use Topics  . Alcohol use: Yes    Alcohol/week: 1.0 standard drink    Types: 1 Standard drinks or equivalent per week  . Drug use: Yes    Frequency: 7.0 times per week    Types: Marijuana    Comment: sometimes at bedtime     Medication list has been reviewed and updated.  Current Meds  Medication Sig  . Cholecalciferol 50 MCG (2000 UT) TABS Take by mouth. As needed  . Coenzyme Q10 (COQ10) 400 MG CAPS Take by mouth daily. As needed  .  FLUoxetine (PROZAC) 20 MG capsule Take 1 capsule (20 mg total) by mouth daily.  Marland Kitchen levOCARNitine (L-CARNITINE) 250 MG TABS Take by mouth.  . levothyroxine (UNITHROID) 100 MCG tablet Take 100 mcg by mouth.  Marland Kitchen LORazepam (ATIVAN) 1 MG tablet Take 1 tablet (1 mg total) by mouth every 8 (eight) hours. 0.5-1 tablets as needed for anxiety  . Multiple Vitamin (MULTIVITAMIN) capsule Take 1 capsule by mouth daily.  . ondansetron (ZOFRAN) 8 MG tablet Take 0.5 tablets (4 mg total) by mouth every 8 (eight) hours as needed for nausea or vomiting.  . SUMAtriptan (IMITREX) 6 MG/0.5ML SOLN injection Inject 6 mg into the skin every 2 (two) hours as needed for migraine or headache. May repeat in 2 hours if headache persists or recurs.    PHQ 2/9  Scores 02/26/2020 12/29/2019  PHQ - 2 Score 0 4  PHQ- 9 Score 0 18    GAD 7 : Generalized Anxiety Score 02/26/2020 12/29/2019  Nervous, Anxious, on Edge 0 3  Control/stop worrying 0 2  Worry too much - different things 0 2  Trouble relaxing 0 3  Restless 0 2  Easily annoyed or irritable 0 2  Afraid - awful might happen 0 2  Total GAD 7 Score 0 16  Anxiety Difficulty Not difficult at all Extremely difficult    BP Readings from Last 3 Encounters:  05/21/20 96/64  02/26/20 104/64  01/27/20 98/61    Physical Exam Vitals and nursing note reviewed.  Constitutional:      General: She is not in acute distress.    Appearance: She is well-developed.  HENT:     Head: Normocephalic and atraumatic.  Cardiovascular:     Rate and Rhythm: Normal rate and regular rhythm.  Pulmonary:     Effort: Pulmonary effort is normal. No respiratory distress.     Breath sounds: No wheezing or rhonchi.  Musculoskeletal:        General: Normal range of motion.     Right lower leg: No edema.     Left lower leg: No edema.  Skin:    General: Skin is warm and dry.     Findings: Rash present.     Comments: Red blistering rash in groups and linear streaks on hands and arms  Neurological:     Mental Status: She is alert and oriented to person, place, and time.  Psychiatric:        Behavior: Behavior normal.        Thought Content: Thought content normal.     Wt Readings from Last 3 Encounters:  05/21/20 160 lb (72.6 kg)  02/26/20 153 lb (69.4 kg)  01/27/20 156 lb 4 oz (70.9 kg)    BP 96/64   Pulse 61   Ht 5\' 8"  (1.727 m)   Wt 160 lb (72.6 kg)   SpO2 97%   BMI 24.33 kg/m   Assessment and Plan: 1. Irritant contact dermatitis due to plants, except food Continue to wrap the affected areas and monitor for bacterial infection Steroid taper - predniSONE (DELTASONE) 10 MG tablet; Take 1 tablet (10 mg total) by mouth as directed for 6 days. Take 6,5,4,3,2,1 then stop  Dispense: 21 tablet; Refill:  0   Partially dictated using Editor, commissioning. Any errors are unintentional.  Halina Maidens, MD [facility]  05/21/2020

## 2020-05-24 DIAGNOSIS — R911 Solitary pulmonary nodule: Secondary | ICD-10-CM | POA: Diagnosis not present

## 2020-05-25 DIAGNOSIS — F4312 Post-traumatic stress disorder, chronic: Secondary | ICD-10-CM | POA: Diagnosis not present

## 2020-06-10 DIAGNOSIS — F4312 Post-traumatic stress disorder, chronic: Secondary | ICD-10-CM | POA: Diagnosis not present

## 2020-06-22 DIAGNOSIS — M542 Cervicalgia: Secondary | ICD-10-CM | POA: Diagnosis not present

## 2020-06-22 DIAGNOSIS — G4489 Other headache syndrome: Secondary | ICD-10-CM | POA: Diagnosis not present

## 2020-06-22 DIAGNOSIS — M546 Pain in thoracic spine: Secondary | ICD-10-CM | POA: Diagnosis not present

## 2020-06-28 ENCOUNTER — Ambulatory Visit (INDEPENDENT_AMBULATORY_CARE_PROVIDER_SITE_OTHER): Payer: BC Managed Care – PPO | Admitting: Internal Medicine

## 2020-06-28 ENCOUNTER — Telehealth: Payer: Self-pay

## 2020-06-28 ENCOUNTER — Other Ambulatory Visit: Payer: Self-pay

## 2020-06-28 ENCOUNTER — Encounter: Payer: Self-pay | Admitting: Internal Medicine

## 2020-06-28 VITALS — BP 128/78 | HR 69 | Ht 68.0 in | Wt 160.0 lb

## 2020-06-28 DIAGNOSIS — N3000 Acute cystitis without hematuria: Secondary | ICD-10-CM | POA: Diagnosis not present

## 2020-06-28 LAB — POC URINALYSIS WITH MICROSCOPIC (NON AUTO)MANUAL RESULT
Bilirubin, UA: NEGATIVE
Blood, UA: NEGATIVE
Crystals: 0
Glucose, UA: NEGATIVE
Ketones, UA: NEGATIVE
Leukocytes, UA: NEGATIVE
Mucus, UA: 0
Nitrite, UA: NEGATIVE
Protein, UA: NEGATIVE
RBC: 2 M/uL — AB (ref 4.04–5.48)
Spec Grav, UA: 1.02 (ref 1.010–1.025)
Urobilinogen, UA: 0.2 E.U./dL
WBC Casts, UA: 5
pH, UA: 6 (ref 5.0–8.0)

## 2020-06-28 MED ORDER — AMOXICILLIN-POT CLAVULANATE 875-125 MG PO TABS: 1.0000 | ORAL_TABLET | Freq: Two times a day (BID) | ORAL | 0 refills | Status: AC

## 2020-06-28 NOTE — Telephone Encounter (Signed)
Copied from [place] [id number]. Topic: Appointment Scheduling - Scheduling Inquiry for Clinic >> Jun 28, 2020  8:23 AM Lennox Solders wrote: Reason for CRM: pt has been having burning and frequency urinating since Friday. Pt does not know if she has a temp. Pt thermometer is not working. Pt would like to be seen today or tomorrow pt prefers today

## 2020-06-28 NOTE — Progress Notes (Signed)
Date:  06/28/2020   Name:  Emma Ayers   DOB:  09-Jul-1966   MRN:  [id number]   Chief Complaint: Urinary Urgency (Started Thursday. Increased fluids- urinated on herself Friday. Taking monistat as of last night. Starting taking wormwood - left over abx amoxicillin- clav. )  Urinary Tract Infection  This is a new problem. The current episode started in the past 7 days. The problem occurs every urination. The problem has been gradually worsening. The quality of the pain is described as burning. The pain is mild. There has been no fever. Associated symptoms include frequency and urgency. Pertinent negatives include no chills.    Lab Results  Component Value Date   CREATININE 0.85 12/29/2019   BUN 11 12/29/2019   NA 142 12/29/2019   K 4.1 12/29/2019   CL 103 12/29/2019   CO2 24 12/29/2019   Lab Results  Component Value Date   CHOL 156 01/27/2020   HDL 71 01/27/2020   LDLCALC 71 01/27/2020   TRIG 74 01/27/2020   CHOLHDL 2.2 01/27/2020   Lab Results  Component Value Date   TSH 4.770 (H) 12/29/2019   No results found for: HGBA1C Lab Results  Component Value Date   WBC 4.8 12/29/2019   HGB 13.4 12/29/2019   HCT 38.8 12/29/2019   MCV 86 12/29/2019   PLT 253 12/29/2019   Lab Results  Component Value Date   ALT 17 12/29/2019   AST 21 12/29/2019   ALKPHOS 59 12/29/2019   BILITOT 0.5 12/29/2019     Review of Systems  Constitutional: Negative for chills and fever.  Respiratory: Negative for chest tightness.   Cardiovascular: Negative for chest pain.  Genitourinary: Positive for dysuria, frequency and urgency.  Neurological: Negative for dizziness and headaches.    Patient Active Problem List   Diagnosis Date Noted  . History of thyroid cancer 12/29/2019  . Anxiety disorder 12/29/2019  . Migraine without aura, not intractable 12/29/2019  . Cervical herniated disc 12/29/2019  . Uterine fibroid 12/29/2019  . Depression, major, single episode, moderate ([place])  12/29/2019  . Vitamin D deficiency 12/29/2019  . Nasal septal deviation 12/29/2019  . Hepatic steatosis 12/29/2019  . Ovarian cyst, right 12/29/2019  . Adenocarcinoma of right lung ([place]) 10/13/2019    Allergies  Allergen Reactions  . Bee Venom Anaphylaxis  . Benzocaine Nausea And Vomiting  . Codeine Nausea Only  . Latex Rash    Past Surgical History:  Procedure Laterality Date  . iv radiation  2018-2019  . radioactive iodine    . 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 uterine ablation  2011    Social History   Tobacco Use  . Smoking status: Never Smoker  . Smokeless tobacco: Never Used  Vaping Use  . Vaping Use: Never used  Substance Use Topics  . Alcohol use: Yes    Alcohol/week: 1.0 standard drink    Types: 1 Standard drinks or equivalent per week  . Drug use: Yes    Frequency: 7.0 times per week    Types: Marijuana    Comment: sometimes at bedtime     Medication list has been reviewed and updated.  Current Meds  Medication Sig  . Cholecalciferol 50 MCG (2000 UT) TABS Take by mouth. As needed  . Coenzyme Q10 (COQ10) 400 MG CAPS Take by mouth daily. As needed  . levOCARNitine (L-CARNITINE) 250 MG TABS Take by mouth.  . levothyroxine (UNITHROID) 100 MCG tablet Take 100 mcg by  mouth.  . miconazole (MICOTIN) 100 MG vaginal suppository Place 100 mg vaginally at bedtime. Used once last night /10-11/2019  . Multiple Vitamin (MULTIVITAMIN) capsule Take 1 capsule by mouth daily.  . ondansetron (ZOFRAN) 8 MG tablet Take 0.5 tablets (4 mg total) by mouth every 8 (eight) hours as needed for nausea or vomiting.  . SUMAtriptan (IMITREX) 6 MG/0.5ML SOLN injection Inject 6 mg into the skin every 2 (two) hours as needed for migraine or headache. May repeat in 2 hours if headache persists or recurs.    PHQ 2/9 Scores 06/28/2020 02/26/2020 12/29/2019  PHQ - 2 Score 0 0 4  PHQ- 9 Score 0 0 18    GAD 7 : Generalized Anxiety Score 06/28/2020 02/26/2020 12/29/2019  Nervous,  Anxious, on Edge 0 0 3  Control/stop worrying 0 0 2  Worry too much - different things 0 0 2  Trouble relaxing 0 0 3  Restless 0 0 2  Easily annoyed or irritable 0 0 2  Afraid - awful might happen 0 0 2  Total GAD 7 Score 0 0 16  Anxiety Difficulty Not difficult at all Not difficult at all Extremely difficult    BP Readings from Last 3 Encounters:  06/28/20 128/78  05/21/20 96/64  02/26/20 104/64    Physical Exam Vitals and nursing note reviewed.  Constitutional:      Appearance: Normal appearance. She is well-developed.  Cardiovascular:     Rate and Rhythm: Normal rate and regular rhythm.     Pulses: Normal pulses.     Heart sounds: Normal heart sounds.  Pulmonary:     Effort: Pulmonary effort is normal. No respiratory distress.     Breath sounds: Normal breath sounds. No wheezing or rhonchi.  Abdominal:     General: Bowel sounds are normal.     Palpations: Abdomen is soft.     Tenderness: There is no abdominal tenderness. There is no guarding or rebound.  Neurological:     Mental Status: She is alert.     Wt Readings from Last 3 Encounters:  06/28/20 160 lb (72.6 kg)  05/21/20 160 lb (72.6 kg)  02/26/20 153 lb (69.4 kg)    BP 128/78   Pulse 69   Ht 5\' 8"  (1.727 m)   Wt 160 lb (72.6 kg)   SpO2 98%   BMI 24.33 kg/m   Assessment and Plan: 1. Acute cystitis without hematuria Increase fluid intake Additional week of augmentin - POC urinalysis w microscopic (non auto) - amoxicillin-clavulanate (AUGMENTIN) 875-125 MG tablet; Take 1 tablet by mouth 2 (two) times daily for 7 days.  Dispense: 14 tablet; Refill: 0   Partially dictated using Editor, commissioning. Any errors are unintentional.  Halina Maidens, MD [facility]  06/28/2020

## 2020-06-29 DIAGNOSIS — F4312 Post-traumatic stress disorder, chronic: Secondary | ICD-10-CM | POA: Diagnosis not present

## 2020-07-22 DIAGNOSIS — F4312 Post-traumatic stress disorder, chronic: Secondary | ICD-10-CM | POA: Diagnosis not present

## 2020-08-05 DIAGNOSIS — F4312 Post-traumatic stress disorder, chronic: Secondary | ICD-10-CM | POA: Diagnosis not present

## 2020-08-25 DIAGNOSIS — F4312 Post-traumatic stress disorder, chronic: Secondary | ICD-10-CM | POA: Diagnosis not present

## 2020-08-26 DIAGNOSIS — F4312 Post-traumatic stress disorder, chronic: Secondary | ICD-10-CM | POA: Diagnosis not present

## 2020-08-30 ENCOUNTER — Other Ambulatory Visit: Payer: Self-pay

## 2020-08-30 ENCOUNTER — Telehealth: Payer: Self-pay

## 2020-08-30 MED ORDER — SUMATRIPTAN SUCCINATE 6 MG/0.5ML ~~LOC~~ SOLN: 6.0000 mg | SUBCUTANEOUS | 0 refills | Status: DC | PRN

## 2020-08-30 NOTE — Telephone Encounter (Unsigned)
Copied from [place] [id number]. Topic: General - Other >> Aug 30, 2020 10:13 AM Alanda Slim E wrote: Reason for CRM: Pt called to speak with Dr. Army Melia or nurse about a 3 day migraine and a Txfor sumatriptan/ please advise  Pt stated she left a message over the weekend as well

## 2020-08-30 NOTE — Telephone Encounter (Signed)
Called and spoke with patient. She said she has had a debilitating migraine for the last 3 days. She only has one sumatriptan injection left. Needs Refills sent to [facility] on [address] in [place]. She did have nausea, vomiting, and diarrhea. Was becoming diaphoretic when she vomited. She had a friend come over to stay with her for a while to make sure she did not need to go to the ER. She does not like to go to the ER because she says they do not know how to care for migraine patients well in the ER. She refused to go to the hospital like to after hours nurses told her to on 08/29/20. Sent in refill will 0 RFS per Dr Army Melia for patient sumatriptan injection. She does have a migrain specialist "up [place]" so she will need to get further refills from them.

## 2020-09-01 ENCOUNTER — Other Ambulatory Visit: Payer: Self-pay

## 2020-09-01 MED ORDER — SUMATRIPTAN SUCCINATE 6 MG/0.5ML ~~LOC~~ SOSY: 6.0000 mg | PREFILLED_SYRINGE | SUBCUTANEOUS | 1 refills | Status: DC | PRN

## 2020-09-01 NOTE — Telephone Encounter (Signed)
Pt called to speak with Chassidy. She states that this is regarding her migraine medication. She states that the Brand name of the medication is Dr. EHUDJ'S [phone] Medication: sumatriptan   [facility]: [phone]

## 2020-09-01 NOTE — Telephone Encounter (Signed)
Spoke with Dr. Army Melia and informed her that patient said she has not seen her headache specialist "up [place]" for 3 years. She said Dr. Army Melia will need to call in the sumatriptan injection for. She only uses Dr. Ephriam Jenkins name brand.She will be using [facility] for this because this is the cheapest she has found after calling around. She said she will be establishing care with ENT, and GI soon and does not know if she will need referrals in the future but will let us know. Unsure of why she needs the GI appt at this time, but says she will be seeing ENT for vertigo since this is her aura for her migraines. She has about 4 bad migraines in a typical year.

## 2020-09-07 DIAGNOSIS — F4312 Post-traumatic stress disorder, chronic: Secondary | ICD-10-CM | POA: Diagnosis not present

## 2020-09-30 DIAGNOSIS — F4312 Post-traumatic stress disorder, chronic: Secondary | ICD-10-CM | POA: Diagnosis not present

## 2020-10-06 ENCOUNTER — Telehealth: Payer: Self-pay

## 2020-10-06 NOTE — Telephone Encounter (Signed)
Patient returned call and asked for a callback from triage nurse to go over clinical message. Please advise

## 2020-10-06 NOTE — Telephone Encounter (Signed)
Please review.  KP

## 2020-10-06 NOTE — Telephone Encounter (Signed)
The only GI problem I see is fatty liver.  Is that why she wants to see GI?  I need a reason for the referral.

## 2020-10-06 NOTE — Telephone Encounter (Signed)
The only thing I can find is a complaint about nasal septal deviation.  Is that why she wants to see ENT?  I have to put a diagnosis on the referral.

## 2020-10-06 NOTE — Telephone Encounter (Signed)
Called pt left VM to call back.  Will route result note to [facility] Nurse Triage for follow up when patient returns call to clinic. Nurse may give results to patient if they return call. CRM created for this message.   KP

## 2020-10-06 NOTE — Telephone Encounter (Signed)
Copied from [place] [phone]. Topic: Referral - Request for Referral >> Oct 06, 2020 12:39 PM Hinda Lenis D wrote: Has patient seen PCP for this complaint? Yes  *If NO, is insurance requiring patient see PCP for this issue before PCP can refer them? Referral for which specialty: Gertie Fey  Preferred provider/office:  [facility] - [facility] // [address] [place],  [place]  [address] [phone] Reason for referral: Gertie Fey

## 2020-10-06 NOTE — Telephone Encounter (Signed)
Copied from [place] [phone]. Topic: Referral - Request for Referral >> Oct 06, 2020 12:43 PM Hinda Lenis D wrote: Has patient seen PCP for this complaint? Yes  *If NO, is insurance requiring patient see PCP for this issue before PCP can refer them? Referral for which specialty: ENT   Preferred provider/office: [facility] Address: [address] [place], [place] [address]  [phone] Reason for referral: ENT

## 2020-10-06 NOTE — Telephone Encounter (Signed)
Pt wants RF with endocrinology.  KP

## 2020-10-06 NOTE — Telephone Encounter (Signed)
Called pt left VM to call back.  Will route result note to [facility] Nurse Triage for follow up when patient returns call to clinic. Nurse may give results to patient if they return call. CRM created for this message.   KP

## 2020-10-07 ENCOUNTER — Ambulatory Visit: Payer: Self-pay

## 2020-10-07 NOTE — Telephone Encounter (Signed)
Called spoke with patient.  She states that she wants appointment to discuss referrals.  She has multiple request. Hx of migraine thyroidectomy herniated disc in neck. She states she is being proactive with her care. Appointment scheduled in office Friday Jan 21st.   Answer Assessment - Initial Assessment Questions 1. REASON FOR CALL or QUESTION: "What is your reason for calling today?" or "How can I best help you?" or "What question do you have that I can help answer?"     I want multiple referrals for different issues.  Protocols used: INFORMATION ONLY CALL - NO TRIAGE-A-AH

## 2020-10-07 NOTE — Telephone Encounter (Signed)
Spoke to pt she has an appt 10/15/2020.  KP

## 2020-10-07 NOTE — Telephone Encounter (Signed)
Called Pt regarding referral to gastroenterologist.  Pt states that she has discussed at length all of her medical issues with Dr. Army Melia, and seems a bit annoyed at the back and forth required for referrals. Pt is seeking referrals for Endocrinology, Gastroenterology, and ENT.   Reason for [facility] referral request was "migraines with vertigo and stomach issues, and history of GERD and food sensitivities".

## 2020-10-15 ENCOUNTER — Ambulatory Visit: Payer: BC Managed Care – PPO | Admitting: Internal Medicine

## 2020-10-19 DIAGNOSIS — F4312 Post-traumatic stress disorder, chronic: Secondary | ICD-10-CM | POA: Diagnosis not present

## 2020-10-28 ENCOUNTER — Ambulatory Visit: Payer: BC Managed Care – PPO | Admitting: Internal Medicine

## 2020-11-10 DIAGNOSIS — F4312 Post-traumatic stress disorder, chronic: Secondary | ICD-10-CM | POA: Diagnosis not present

## 2020-11-15 DIAGNOSIS — C3491 Malignant neoplasm of unspecified part of right bronchus or lung: Secondary | ICD-10-CM | POA: Diagnosis not present

## 2020-11-15 DIAGNOSIS — R911 Solitary pulmonary nodule: Secondary | ICD-10-CM | POA: Diagnosis not present

## 2020-11-19 ENCOUNTER — Encounter: Payer: Self-pay | Admitting: Internal Medicine

## 2020-11-19 ENCOUNTER — Ambulatory Visit: Payer: BC Managed Care – PPO | Admitting: Internal Medicine

## 2020-11-19 ENCOUNTER — Other Ambulatory Visit: Payer: Self-pay

## 2020-11-19 VITALS — BP 110/88 | HR 67 | Temp 98.3°F | Ht 68.0 in | Wt 165.0 lb

## 2020-11-19 DIAGNOSIS — D485 Neoplasm of uncertain behavior of skin: Secondary | ICD-10-CM

## 2020-11-19 DIAGNOSIS — F321 Major depressive disorder, single episode, moderate: Secondary | ICD-10-CM

## 2020-11-19 DIAGNOSIS — J342 Deviated nasal septum: Secondary | ICD-10-CM | POA: Diagnosis not present

## 2020-11-19 DIAGNOSIS — G43009 Migraine without aura, not intractable, without status migrainosus: Secondary | ICD-10-CM

## 2020-11-19 DIAGNOSIS — C3491 Malignant neoplasm of unspecified part of right bronchus or lung: Secondary | ICD-10-CM

## 2020-11-19 DIAGNOSIS — Z85850 Personal history of malignant neoplasm of thyroid: Secondary | ICD-10-CM

## 2020-11-19 NOTE — Progress Notes (Signed)
Date:  11/19/2020   Name:  Emma Ayers   DOB:  [birth year]   MRN:  [id number]   Chief Complaint: Referral (ENT, endocrine, GI, OBGYN, dermatologist/)  Thyroid Problem Presents for follow-up visit. Patient reports no anxiety, constipation, diarrhea, fatigue or palpitations. The symptoms have been stable (seen by Dr. Gabriel Carina but it was not a good fit.  remains on thyroid medication - nature thyroid I believe).  Sinus Problem This is a chronic problem. Associated symptoms include congestion. Pertinent negatives include no coughing, headaches or shortness of breath. (Has deviated septum and attendant issues)  Migraine  This is a chronic problem. The problem occurs seasonly (less than 8 per year). The problem has been unchanged. Associated symptoms include abdominal pain (intermittent issues with sensitivities) and a loss of balance (and vertigo). Pertinent negatives include no coughing, dizziness or weakness. She has tried triptans (zofran) for the symptoms. The treatment provided moderate relief.  Depression        This is a chronic problem.  The problem has been gradually improving since onset.  Associated symptoms include no fatigue and no headaches.     The symptoms are aggravated by family issues and social issues.  Past treatments include SSRIs - Selective serotonin reuptake inhibitors (and occasional lorazepam).  Past medical history includes thyroid problem.   Adenocarcinoma of lung - recent follow up with stable appearing scans.  Will continue 8-10 mo follow up and work on quitting smoking.  Lab Results  Component Value Date   CREATININE 0.85 12/29/2019   BUN 11 12/29/2019   NA 142 12/29/2019   K 4.1 12/29/2019   CL 103 12/29/2019   CO2 24 12/29/2019   Lab Results  Component Value Date   CHOL 156 01/27/2020   HDL 71 01/27/2020   LDLCALC 71 01/27/2020   TRIG 74 01/27/2020   CHOLHDL 2.2 01/27/2020   Lab Results  Component Value Date   TSH 4.770 (H) 12/29/2019   No  results found for: HGBA1C Lab Results  Component Value Date   WBC 4.8 12/29/2019   HGB 13.4 12/29/2019   HCT 38.8 12/29/2019   MCV 86 12/29/2019   PLT 253 12/29/2019   Lab Results  Component Value Date   ALT 17 12/29/2019   AST 21 12/29/2019   ALKPHOS 59 12/29/2019   BILITOT 0.5 12/29/2019     Review of Systems  Constitutional: Negative for fatigue and unexpected weight change.  HENT: Positive for congestion. Negative for nosebleeds and trouble swallowing.   Eyes: Negative for visual disturbance.  Respiratory: Negative for cough, chest tightness, shortness of breath and wheezing.   Cardiovascular: Negative for chest pain, palpitations and leg swelling.  Gastrointestinal: Positive for abdominal pain (intermittent issues with sensitivities). Negative for constipation and diarrhea.  Neurological: Positive for loss of balance (and vertigo). Negative for dizziness, weakness, light-headedness and headaches.  Psychiatric/Behavioral: Positive for depression. Negative for dysphoric mood and sleep disturbance. The patient is not nervous/anxious.     Patient Active Problem List   Diagnosis Date Noted  . History of thyroid cancer 12/29/2019  . Anxiety disorder 12/29/2019  . Migraine without aura, not intractable 12/29/2019  . Cervical herniated disc 12/29/2019  . Uterine fibroid 12/29/2019  . Depression, major, single episode, moderate ([place]) 12/29/2019  . Vitamin D deficiency 12/29/2019  . Nasal septal deviation 12/29/2019  . Hepatic steatosis 12/29/2019  . Ovarian cyst, right 12/29/2019  . Adenocarcinoma of right lung ([place]) 10/13/2019    Allergies  Allergen Reactions  .  Bee Venom Anaphylaxis  . Benzocaine Nausea And Vomiting  . Codeine Nausea Only  . Latex Rash    Past Surgical History:  Procedure Laterality Date  . radioactive iodine  2009  . 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 uterine ablation  2011    Social History   Tobacco Use  . Smoking status:  Never Smoker  . Smokeless tobacco: Never Used  Vaping Use  . Vaping Use: Never used  Substance Use Topics  . Alcohol use: Yes    Alcohol/week: 1.0 standard drink    Types: 1 Standard drinks or equivalent per week  . Drug use: Yes    Frequency: 7.0 times per week    Types: Marijuana    Comment: sometimes at bedtime     Medication list has been reviewed and updated.  Current Meds  Medication Sig  . Cholecalciferol 50 MCG (2000 UT) TABS Take by mouth. As needed  . Coenzyme Q10 (COQ10) 400 MG CAPS Take by mouth daily. As needed  . FLUoxetine (PROZAC) 20 MG capsule Take 1 capsule (20 mg total) by mouth daily.  Marland Kitchen levothyroxine (SYNTHROID) 100 MCG tablet Take 100 mcg by mouth.  Marland Kitchen LORazepam (ATIVAN) 1 MG tablet Take 1 tablet (1 mg total) by mouth every 8 (eight) hours. 0.5-1 tablets as needed for anxiety  . Multiple Vitamin (MULTIVITAMIN) capsule Take 1 capsule by mouth daily.  . ondansetron (ZOFRAN) 8 MG tablet Take 0.5 tablets (4 mg total) by mouth every 8 (eight) hours as needed for nausea or vomiting.  . SUMAtriptan (IMITREX) 6 MG/0.5ML SOSY injection Inject 0.5 mLs (6 mg total) into the skin every 2 (two) hours as needed for migraine or headache. F    PHQ 2/9 Scores 11/19/2020 06/28/2020 02/26/2020 12/29/2019  PHQ - 2 Score 0 0 0 4  PHQ- 9 Score 0 0 0 18    GAD 7 : Generalized Anxiety Score 11/19/2020 06/28/2020 02/26/2020 12/29/2019  Nervous, Anxious, on Edge 0 0 0 3  Control/stop worrying 0 0 0 2  Worry too much - different things 0 0 0 2  Trouble relaxing 0 0 0 3  Restless 0 0 0 2  Easily annoyed or irritable 0 0 0 2  Afraid - awful might happen 0 0 0 2  Total GAD 7 Score 0 0 0 16  Anxiety Difficulty - Not difficult at all Not difficult at all Extremely difficult    BP Readings from Last 3 Encounters:  11/19/20 110/88  06/28/20 128/78  05/21/20 96/64    Physical Exam Vitals and nursing note reviewed.  Constitutional:      General: She is not in acute distress.     Appearance: Normal appearance. She is well-developed.  HENT:     Head: Normocephalic and atraumatic.  Cardiovascular:     Rate and Rhythm: Normal rate and regular rhythm.     Pulses: Normal pulses.     Heart sounds: No murmur heard.   Pulmonary:     Effort: Pulmonary effort is normal. No respiratory distress.     Breath sounds: No wheezing or rhonchi.  Musculoskeletal:     Cervical back: Normal range of motion.  Lymphadenopathy:     Cervical: No cervical adenopathy.  Skin:    General: Skin is warm and dry.     Findings: Lesion present. No rash.     Comments: Scattered non specific lesions on arm and abdomen  Neurological:     General: No focal deficit present.  Mental Status: She is alert and oriented to person, place, and time.  Psychiatric:        Mood and Affect: Mood normal.        Behavior: Behavior normal.     Wt Readings from Last 3 Encounters:  11/19/20 165 lb (74.8 kg)  06/28/20 160 lb (72.6 kg)  05/21/20 160 lb (72.6 kg)    BP 110/88   Pulse 67   Temp 98.3 F (36.8 C) (Oral)   Ht 5\' 8"  (1.727 m)   Wt 165 lb (74.8 kg)   SpO2 98%   BMI 25.09 kg/m   Assessment and Plan: 1. History of thyroid cancer On supplements - wants to change provider for monitoring - Ambulatory referral to ENT  2. Nasal septal deviation Continue allergy medications and see ENT - Ambulatory referral to ENT  3. Neoplasm of uncertain behavior of skin - Ambulatory referral to Dermatology  4. Migraine without aura and without status migrainosus, not intractable Well controlled with only occasional headaches Not requiring preventative therapy at this time Continue imitrex inj and zofran PRN  5. Adenocarcinoma of right lung ([place]) Stable appearance s/p SBRT  Continue follow up with [facility]  6. Depression, major, single episode, moderate (HCC) Clinically stable on current regimen with good control of symptoms, No SI or HI. Will continue current therapy.   Partially  dictated using Editor, commissioning. Any errors are unintentional.  Halina Maidens, MD [facility]  11/19/2020

## 2020-11-23 ENCOUNTER — Telehealth: Payer: Self-pay

## 2020-11-23 NOTE — Telephone Encounter (Signed)
Copied from [place] [id number]. Topic: General - Other >> Nov 23, 2020  4:29 PM Pawlus, Brayton Layman A wrote: Reason for CRM: Pt requested a call back from Dr Army Melia or her nurse, she needs to get labs done for her thyroid and wanted to further discuss.

## 2020-11-24 NOTE — Telephone Encounter (Signed)
Spoke to pt let her know that she would have to wait until she sees ENT to get labs. Pt verbalized understanding.  KP

## 2020-11-24 NOTE — Telephone Encounter (Signed)
Please review. Pt wants thyroid labs done. Pt has an appt with ENT in April ans would like to discuss newer labs with Dr. At her appt. Pts last labs for thyroid was 12/29/19. Pt requested thyroglobulin, TSH, T3 abnd T4 active and inactive.  KP

## 2020-11-24 NOTE — Telephone Encounter (Signed)
I think she needs to wait to see ENT and let him order the labs that he wants.  If I order labs, he is likely to want something else.  Since all the labs were normal last April, I think it is acceptable to wait for the specialist visit.

## 2020-11-26 ENCOUNTER — Other Ambulatory Visit: Payer: Self-pay | Admitting: Internal Medicine

## 2020-12-07 DIAGNOSIS — F4312 Post-traumatic stress disorder, chronic: Secondary | ICD-10-CM | POA: Diagnosis not present

## 2020-12-22 DIAGNOSIS — L578 Other skin changes due to chronic exposure to nonionizing radiation: Secondary | ICD-10-CM | POA: Diagnosis not present

## 2020-12-22 DIAGNOSIS — L738 Other specified follicular disorders: Secondary | ICD-10-CM | POA: Diagnosis not present

## 2020-12-22 DIAGNOSIS — L570 Actinic keratosis: Secondary | ICD-10-CM | POA: Diagnosis not present

## 2020-12-22 DIAGNOSIS — D225 Melanocytic nevi of trunk: Secondary | ICD-10-CM | POA: Diagnosis not present

## 2020-12-28 ENCOUNTER — Other Ambulatory Visit (HOSPITAL_COMMUNITY): Payer: Self-pay | Admitting: Otolaryngology

## 2020-12-28 ENCOUNTER — Other Ambulatory Visit: Payer: Self-pay | Admitting: Otolaryngology

## 2020-12-28 DIAGNOSIS — J342 Deviated nasal septum: Secondary | ICD-10-CM | POA: Diagnosis not present

## 2020-12-28 DIAGNOSIS — J305 Allergic rhinitis due to food: Secondary | ICD-10-CM | POA: Diagnosis not present

## 2020-12-28 DIAGNOSIS — Z85850 Personal history of malignant neoplasm of thyroid: Secondary | ICD-10-CM

## 2021-01-04 DIAGNOSIS — F4312 Post-traumatic stress disorder, chronic: Secondary | ICD-10-CM | POA: Diagnosis not present

## 2021-01-28 ENCOUNTER — Other Ambulatory Visit: Payer: Self-pay

## 2021-01-31 ENCOUNTER — Encounter: Payer: Self-pay | Admitting: Internal Medicine

## 2021-02-02 ENCOUNTER — Ambulatory Visit
Admission: RE | Admit: 2021-02-02 | Discharge: 2021-02-02 | Disposition: A | Discharge status: Home / Self Care | Payer: BC Managed Care – PPO | Source: Ambulatory Visit | Attending: Otolaryngology | Admitting: Otolaryngology

## 2021-02-02 ENCOUNTER — Other Ambulatory Visit: Payer: Self-pay

## 2021-02-02 DIAGNOSIS — Z85850 Personal history of malignant neoplasm of thyroid: Secondary | ICD-10-CM

## 2021-02-02 DIAGNOSIS — C73 Malignant neoplasm of thyroid gland: Secondary | ICD-10-CM | POA: Diagnosis not present

## 2021-02-02 DIAGNOSIS — E890 Postprocedural hypothyroidism: Secondary | ICD-10-CM | POA: Diagnosis not present

## 2021-02-10 DIAGNOSIS — F4312 Post-traumatic stress disorder, chronic: Secondary | ICD-10-CM | POA: Diagnosis not present

## 2021-02-16 DIAGNOSIS — M9902 Segmental and somatic dysfunction of thoracic region: Secondary | ICD-10-CM | POA: Diagnosis not present

## 2021-02-16 DIAGNOSIS — M542 Cervicalgia: Secondary | ICD-10-CM | POA: Diagnosis not present

## 2021-02-16 DIAGNOSIS — M546 Pain in thoracic spine: Secondary | ICD-10-CM | POA: Diagnosis not present

## 2021-02-16 DIAGNOSIS — M9901 Segmental and somatic dysfunction of cervical region: Secondary | ICD-10-CM | POA: Diagnosis not present

## 2021-02-17 DIAGNOSIS — F4312 Post-traumatic stress disorder, chronic: Secondary | ICD-10-CM | POA: Diagnosis not present

## 2021-03-01 DIAGNOSIS — Z85850 Personal history of malignant neoplasm of thyroid: Secondary | ICD-10-CM | POA: Diagnosis not present

## 2021-03-01 DIAGNOSIS — E890 Postprocedural hypothyroidism: Secondary | ICD-10-CM | POA: Diagnosis not present

## 2021-03-01 DIAGNOSIS — R42 Dizziness and giddiness: Secondary | ICD-10-CM | POA: Diagnosis not present

## 2021-03-01 DIAGNOSIS — J301 Allergic rhinitis due to pollen: Secondary | ICD-10-CM | POA: Diagnosis not present

## 2021-03-08 DIAGNOSIS — F4312 Post-traumatic stress disorder, chronic: Secondary | ICD-10-CM | POA: Diagnosis not present

## 2021-03-14 ENCOUNTER — Other Ambulatory Visit: Payer: Self-pay

## 2021-03-14 ENCOUNTER — Other Ambulatory Visit: Payer: Self-pay | Admitting: Internal Medicine

## 2021-03-14 NOTE — Telephone Encounter (Signed)
Per pt ENT provider should refill medication, per pt Dr. Army Melia needs to follow through with Dr. Kathyrn Sheriff

## 2021-03-14 NOTE — Telephone Encounter (Signed)
Requested medication (s) are due for refill today:  yes   Requested medication (s) are on the active medication list:  yes   Last refill: 11/26/2020  Future visit scheduled: no  Notes to clinic:   TSH needs to be rechecked within 3 months after an abnormal result. Refill until TSH is due Requested Prescriptions  Pending Prescriptions Disp Refills   UNITHROID 100 MCG tablet [Pharmacy Med Name: UNITHROID TABS 100MCG] 90 tablet 3    Sig: TAKE 1 TABLET DAILY BEFORE BREAKFAST      Endocrinology:  Hypothyroid Agents Failed - 03/14/2021  8:29 AM      Failed - TSH needs to be rechecked within 3 months after an abnormal result. Refill until TSH is due.      Failed - TSH in normal range and within 360 days    TSH  Date Value Ref Range Status  12/29/2019 4.770 (H) 0.450 - 4.500 uIU/mL Final          Passed - Valid encounter within last 12 months    Recent Outpatient Visits           3 months ago History of thyroid cancer   [facility] Glean Hess, MD   8 months ago Acute cystitis without hematuria   [facility] Glean Hess, MD   9 months ago Irritant contact dermatitis due to plants, except food   [facility] Glean Hess, MD   1 year ago Migraine without aura and without status migrainosus, not intractable   [facility] Glean Hess, MD   1 year ago History of thyroid cancer   [facility] Glean Hess, MD

## 2021-03-16 DIAGNOSIS — F4312 Post-traumatic stress disorder, chronic: Secondary | ICD-10-CM | POA: Diagnosis not present

## 2021-03-31 DIAGNOSIS — Z85850 Personal history of malignant neoplasm of thyroid: Secondary | ICD-10-CM | POA: Diagnosis not present

## 2021-04-07 DIAGNOSIS — F4312 Post-traumatic stress disorder, chronic: Secondary | ICD-10-CM | POA: Diagnosis not present

## 2021-05-05 DIAGNOSIS — F4312 Post-traumatic stress disorder, chronic: Secondary | ICD-10-CM | POA: Diagnosis not present

## 2021-05-31 ENCOUNTER — Other Ambulatory Visit: Payer: Self-pay

## 2021-05-31 ENCOUNTER — Ambulatory Visit: Payer: Self-pay

## 2021-05-31 ENCOUNTER — Encounter: Payer: Self-pay | Admitting: Internal Medicine

## 2021-05-31 ENCOUNTER — Ambulatory Visit: Payer: BC Managed Care – PPO | Admitting: Internal Medicine

## 2021-05-31 VITALS — BP 104/78 | HR 75 | Temp 98.3°F | Ht 68.0 in | Wt 169.0 lb

## 2021-05-31 DIAGNOSIS — W57XXXA Bitten or stung by nonvenomous insect and other nonvenomous arthropods, initial encounter: Secondary | ICD-10-CM | POA: Diagnosis not present

## 2021-05-31 DIAGNOSIS — L03314 Cellulitis of groin: Secondary | ICD-10-CM

## 2021-05-31 DIAGNOSIS — S30861A Insect bite (nonvenomous) of abdominal wall, initial encounter: Secondary | ICD-10-CM

## 2021-05-31 MED ORDER — DOXYCYCLINE HYCLATE 100 MG PO TABS: 100.0000 mg | ORAL_TABLET | Freq: Two times a day (BID) | ORAL | 0 refills | Status: DC

## 2021-05-31 NOTE — Telephone Encounter (Signed)
Noted  KP 

## 2021-05-31 NOTE — Telephone Encounter (Signed)
Patient called and says on Saturday she was bit by 3 ticks that she removed that were engorged, they were embedded. She says they were in the left groin up to her panty line and down the leg. She says she has a bulls eye rash, bruise looking area and raised bump that she's able to see with a mirror, as well as a rash to her lower abdomen. 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 I asked if a video visit is ok, she agrees, but is not able to access her MyChart. She says she will work on getting that access before the visit. MyChart video visit scheduled for today at 1540 with Dr. Army Melia, care advice given, patient verbalized understanding.   Reason for Disposition  Red ring or bull's-eye rash occurs at tick bite  Answer Assessment - Initial Assessment Questions 1. TYPE of TICK: "Is it a wood tick or a deer tick?" (e.g., deer tick, wood tick; unsure)     Nymph 2. SIZE of TICK: "How big is the tick?" (e.g., size of poppy seed, apple seed, watermelon seed; unsure) Note: Deer ticks can be the size of a poppy seed (nymph) or an apple seed (adult).       Very tiny one 3. ENGORGED: "Did the tick look flat or engorged (full, swollen)?" (e.g., flat, engorged; unsure)     All engorged, 3 ticks embedded 4. LOCATION: "Where is the tick bite located?"      Left groin 5. ONSET: "How long do you think the tick was attached before you removed it?" (e.g., 5 hours, 2 days)      Probably 24 hours 6. APPEARANCE of BITE or RASH: "What does the site look like?"     Rash on lower abdomen, small bulls eye, raised bumps 7. PREGNANCY: "Is there any chance you are pregnant?" "When was your last menstrual period?"     No  Protocols used: Tick Bite-A-AH

## 2021-05-31 NOTE — Progress Notes (Signed)
Date:  05/31/2021   Name:  Emma Ayers   DOB:  [birth year]   MRN:  [id number]   Chief Complaint: Insect Bite (X4 days, Tick bites to groin, rash, wants antibiotic, had 3 stuck to skin, redness, itching)  HPI  Lab Results  Component Value Date   CREATININE 0.85 12/29/2019   BUN 11 12/29/2019   NA 142 12/29/2019   K 4.1 12/29/2019   CL 103 12/29/2019   CO2 24 12/29/2019   Lab Results  Component Value Date   CHOL 156 01/27/2020   HDL 71 01/27/2020   LDLCALC 71 01/27/2020   TRIG 74 01/27/2020   CHOLHDL 2.2 01/27/2020   Lab Results  Component Value Date   TSH 4.770 (H) 12/29/2019   No results found for: HGBA1C Lab Results  Component Value Date   WBC 4.8 12/29/2019   HGB 13.4 12/29/2019   HCT 38.8 12/29/2019   MCV 86 12/29/2019   PLT 253 12/29/2019   Lab Results  Component Value Date   ALT 17 12/29/2019   AST 21 12/29/2019   ALKPHOS 59 12/29/2019   BILITOT 0.5 12/29/2019     Review of Systems  Constitutional:  Positive for fatigue. Negative for chills and fever.  Respiratory:  Negative for chest tightness and shortness of breath.   Cardiovascular:  Negative for chest pain and palpitations.  Skin:  Positive for rash.       No target lesions, no diffuse rash  Neurological:  Negative for dizziness and headaches.   Patient Active Problem List   Diagnosis Date Noted   History of thyroid cancer 12/29/2019   Anxiety disorder 12/29/2019   Migraine without aura, not intractable 12/29/2019   Cervical herniated disc 12/29/2019   Uterine fibroid 12/29/2019   Depression, major, single episode, moderate (HCC) 12/29/2019   Vitamin D deficiency 12/29/2019   Nasal septal deviation 12/29/2019   Hepatic steatosis 12/29/2019   Ovarian cyst, right 12/29/2019   Adenocarcinoma of right lung ([place]) 10/13/2019    Allergies  Allergen Reactions   Bee Venom Anaphylaxis   Benzocaine Nausea And Vomiting   Codeine Nausea Only   Latex Rash    Past Surgical History:   Procedure Laterality Date   radioactive iodine  2009   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   uterine ablation  2011    Social History   Tobacco Use   Smoking status: Never   Smokeless tobacco: Never  Vaping Use   Vaping Use: Never used  Substance Use Topics   Alcohol use: Yes    Alcohol/week: 1.0 standard drink    Types: 1 Standard drinks or equivalent per week   Drug use: Yes    Frequency: 7.0 times per week    Types: Marijuana    Comment: sometimes at bedtime     Medication list has been reviewed and updated.  Current Meds  Medication Sig   Cholecalciferol 50 MCG (2000 UT) TABS Take by mouth. As needed   FLUoxetine (PROZAC) 20 MG capsule Take 1 capsule (20 mg total) by mouth daily.   levothyroxine (SYNTHROID) 25 MCG tablet Take 25 mcg by mouth daily before breakfast.   LORazepam (ATIVAN) 1 MG tablet Take 1 tablet (1 mg total) by mouth every 8 (eight) hours. 0.5-1 tablets as needed for anxiety   Multiple Vitamin (MULTIVITAMIN) capsule Take 1 capsule by mouth daily.   ondansetron (ZOFRAN) 8 MG tablet Take 0.5 tablets (4 mg total) by mouth every 8 (eight) hours as  needed for nausea or vomiting.   SUMAtriptan (IMITREX) 6 MG/0.5ML SOSY injection Inject 0.5 mLs (6 mg total) into the skin every 2 (two) hours as needed for migraine or headache. F   UNITHROID 100 MCG tablet TAKE 1 TABLET DAILY BEFORE BREAKFAST   [DISCONTINUED] Coenzyme Q10 (COQ10) 400 MG CAPS Take by mouth daily. As needed    PHQ 2/9 Scores 05/31/2021 11/19/2020 06/28/2020 02/26/2020  PHQ - 2 Score 0 0 0 0  PHQ- 9 Score 1 0 0 0    GAD 7 : Generalized Anxiety Score 05/31/2021 11/19/2020 06/28/2020 02/26/2020  Nervous, Anxious, on Edge 0 0 0 0  Control/stop worrying 0 0 0 0  Worry too much - different things 0 0 0 0  Trouble relaxing 0 0 0 0  Restless 0 0 0 0  Easily annoyed or irritable 0 0 0 0  Afraid - awful might happen 0 0 0 0  Total GAD 7 Score 0 0 0 0  Anxiety Difficulty - - Not difficult at all  Not difficult at all    BP Readings from Last 3 Encounters:  05/31/21 104/78  11/19/20 110/88  06/28/20 128/78    Physical Exam Vitals and nursing note reviewed.  Constitutional:      General: She is not in acute distress.    Appearance: She is well-developed.  HENT:     Head: Normocephalic and atraumatic.  Cardiovascular:     Rate and Rhythm: Normal rate and regular rhythm.  Pulmonary:     Effort: Pulmonary effort is normal. No respiratory distress.     Breath sounds: No wheezing or rhonchi.  Skin:    General: Skin is warm and dry.     Findings: Rash present. Rash is papular. Rash is not crusting or urticarial.     Comments: Scattered red papular lesions in groin and thighs   Neurological:     Mental Status: She is alert and oriented to person, place, and time.  Psychiatric:        Mood and Affect: Mood normal.        Behavior: Behavior normal.    Wt Readings from Last 3 Encounters:  05/31/21 169 lb (76.7 kg)  11/19/20 165 lb (74.8 kg)  06/28/20 160 lb (72.6 kg)    BP 104/78   Pulse 75   Temp 98.3 F (36.8 C) (Oral)   Ht 5\' 8"  (1.727 m)   Wt 169 lb (76.7 kg)   SpO2 96%   BMI 25.70 kg/m   Assessment and Plan: 1. Cellulitis of groin Will cover for RMSF and Lyme - doxycycline (VIBRA-TABS) 100 MG tablet; Take 1 tablet (100 mg total) by mouth 2 (two) times daily for 10 days.  Dispense: 20 tablet; Refill: 0  2. Tick bite of abdomen, initial encounter Local care to lesions, topical steroid if pruritic.    Partially dictated using Editor, commissioning. Any errors are unintentional.  Halina Maidens, MD [facility]  05/31/2021

## 2021-06-01 ENCOUNTER — Ambulatory Visit: Payer: BC Managed Care – PPO | Admitting: Internal Medicine

## 2021-06-02 DIAGNOSIS — F4312 Post-traumatic stress disorder, chronic: Secondary | ICD-10-CM | POA: Diagnosis not present

## 2021-06-09 ENCOUNTER — Other Ambulatory Visit: Payer: Self-pay

## 2021-06-09 ENCOUNTER — Telehealth: Payer: Self-pay

## 2021-06-09 NOTE — Telephone Encounter (Signed)
Copied from [place] [id number]. Topic: General - Other >> Jun 09, 2021 10:27 AM Celene Kras wrote: Reason for CRM: Pt called stating that she tested positive for covid on 06/04/21. She states that she was not able to finish the doxycycline for the tick bite due to this and is requesting to have her record reflect. Please advise.

## 2021-06-09 NOTE — Telephone Encounter (Signed)
Doxycycline taken off med list.

## 2021-06-16 DIAGNOSIS — F4312 Post-traumatic stress disorder, chronic: Secondary | ICD-10-CM | POA: Diagnosis not present

## 2021-06-22 ENCOUNTER — Other Ambulatory Visit: Payer: Self-pay | Admitting: Internal Medicine

## 2021-06-22 NOTE — Telephone Encounter (Signed)
Requested medication (s) are due for refill today: yes  Requested medication (s) are on the active medication list: yes  Last refill:  03/14/21 #90 0 refills  Future visit scheduled: no  Notes to clinic:  last TSH 12/29/19 abnormal result no recheck TSH with in 3 months . Do you want to refill Rx.prior to labs?     Requested Prescriptions  Pending Prescriptions Disp Refills   UNITHROID 100 MCG tablet [Pharmacy Med Name: UNITHROID TABS 100MCG] 90 tablet 3    Sig: TAKE 1 TABLET DAILY BEFORE BREAKFAST     Endocrinology:  Hypothyroid Agents Failed - 06/22/2021 10:59 AM      Failed - TSH needs to be rechecked within 3 months after an abnormal result. Refill until TSH is due.      Failed - TSH in normal range and within 360 days    TSH  Date Value Ref Range Status  12/29/2019 4.770 (H) 0.450 - 4.500 uIU/mL Final          Passed - Valid encounter within last 12 months    Recent Outpatient Visits           3 weeks ago Cellulitis of groin   [facility] Glean Hess, MD   7 months ago History of thyroid cancer   [facility] Glean Hess, MD   11 months ago Acute cystitis without hematuria   [facility] Glean Hess, MD   1 year ago Irritant contact dermatitis due to plants, except food   [facility] Glean Hess, MD   1 year ago Migraine without aura and without status migrainosus, not intractable   [facility] Glean Hess, MD

## 2021-06-22 NOTE — Telephone Encounter (Signed)
Patient gets Rx through her ENT doctor. She would contact them to send it

## 2021-07-03 IMAGING — US US THYROID
1 series · 14 of 25 positions shown · non-contrast
Comparison: None.

CLINICAL DATA: Malignant neoplasm of the thyroid status post
thyroidectomy

EXAM:
THYROID ULTRASOUND
TECHNIQUE: Ultrasound examination of the thyroid gland and adjacent soft
tissues was performed.

[Series 1: us thyroid · 32 acquisitions, 14 frames shown]
[im 1/32]
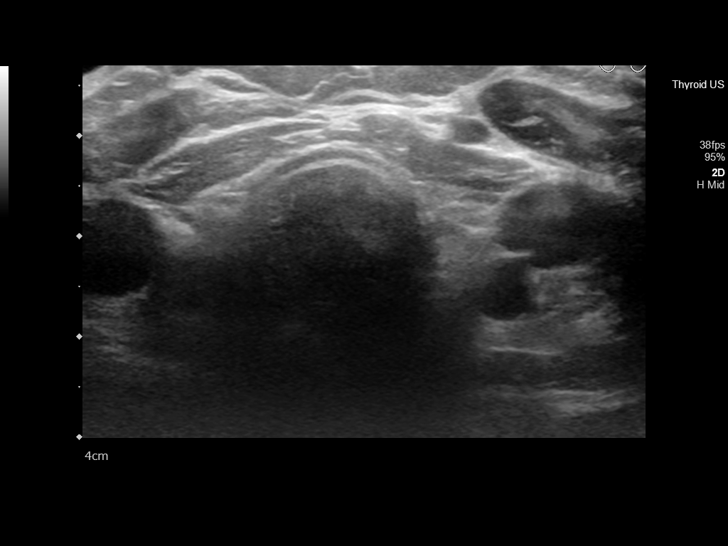
[im 3/32]
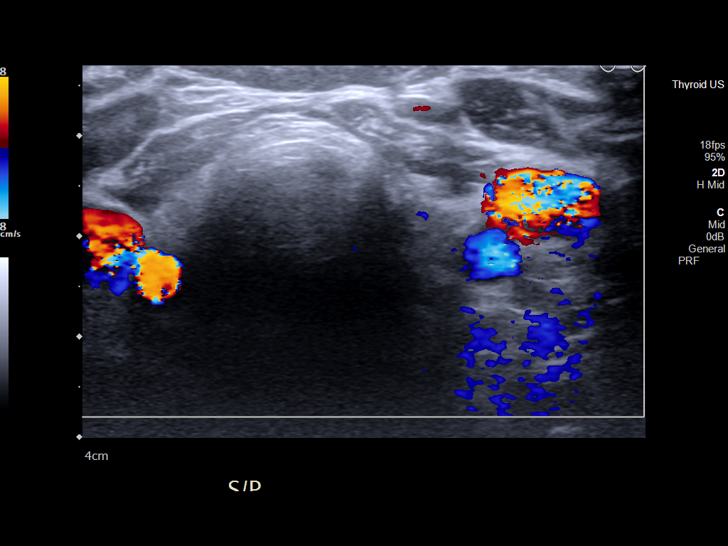
[im 6/32]
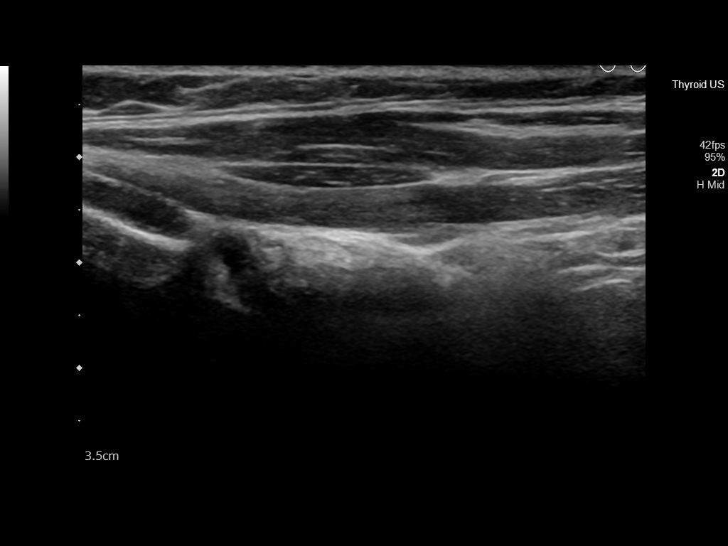
[im 8/32]
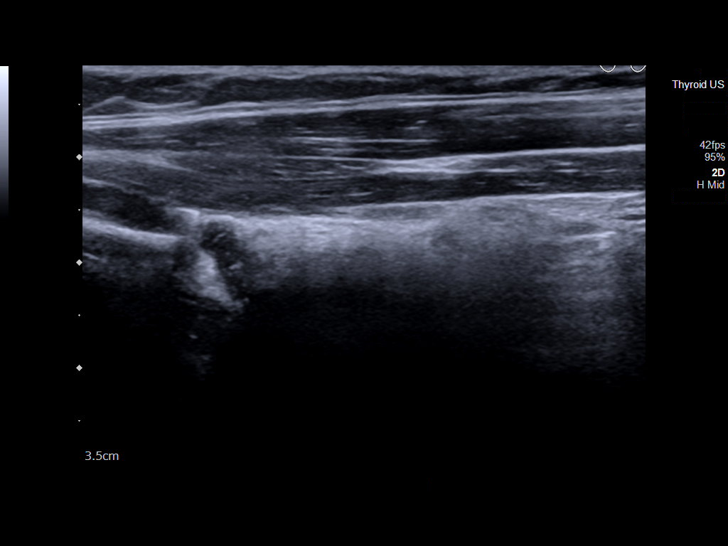
[im 11/32]
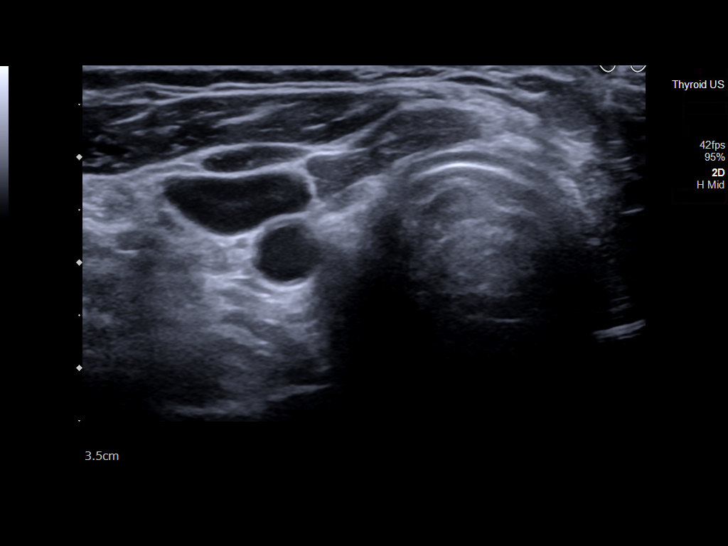
[im 12/32]
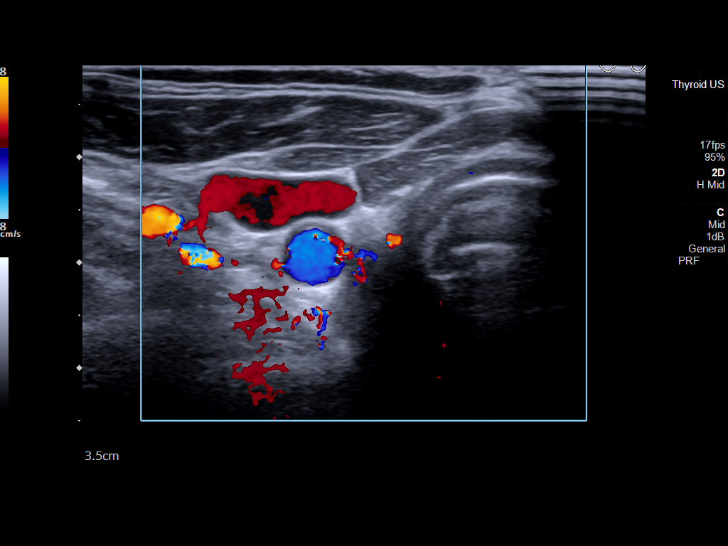
[im 15/32]
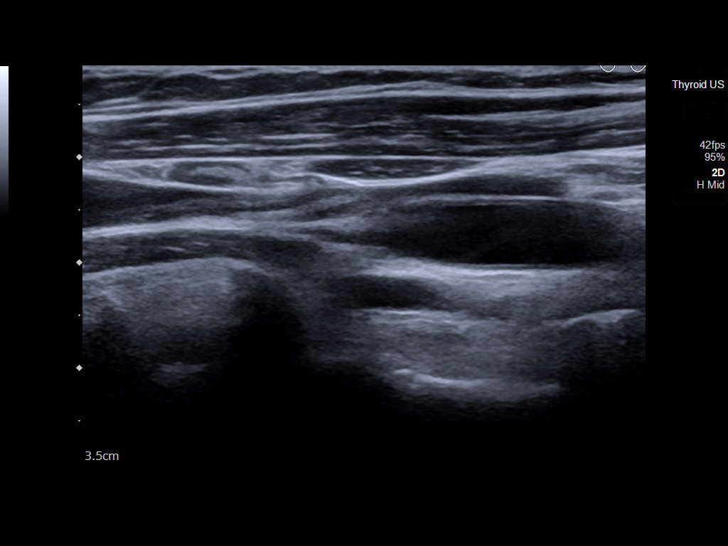
[im 17/32]
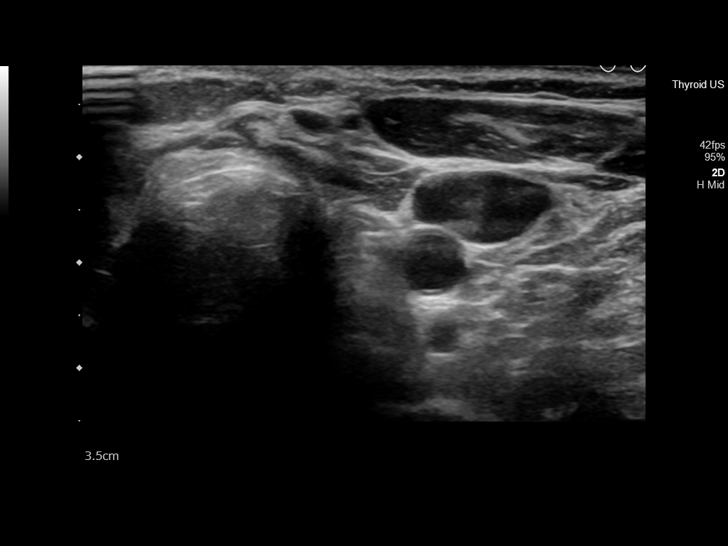
[im 20/32]
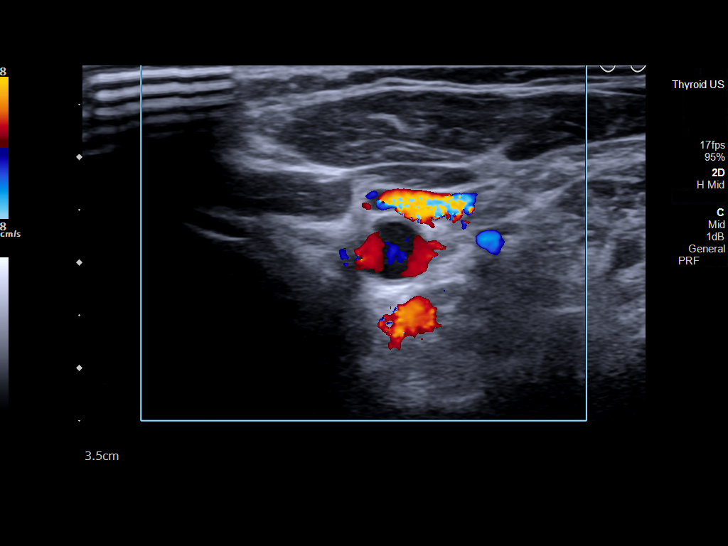
[im 21/32]
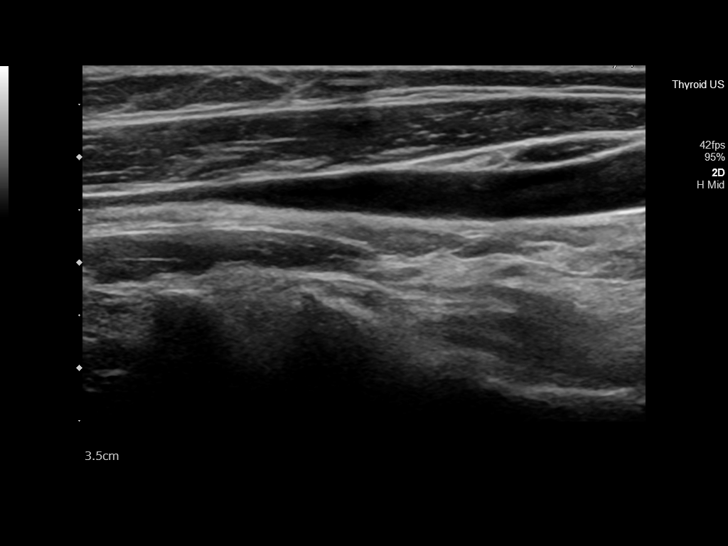
[im 24/32]
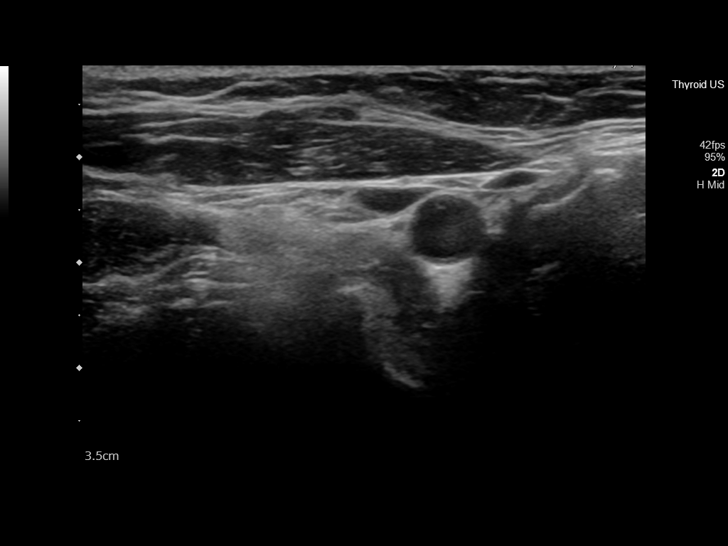
[im 26/32]
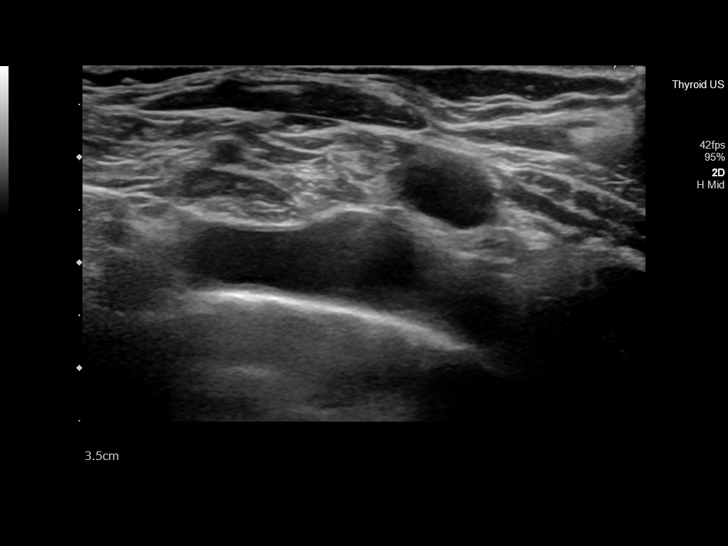
[im 29/32]
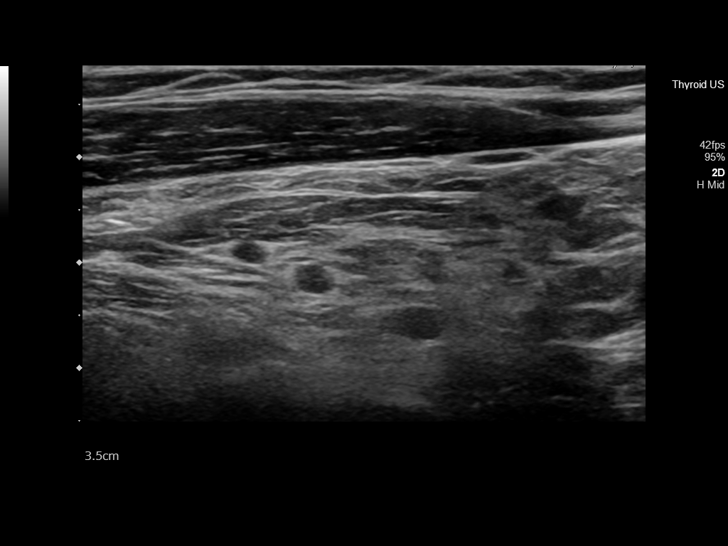
[im 32/32]
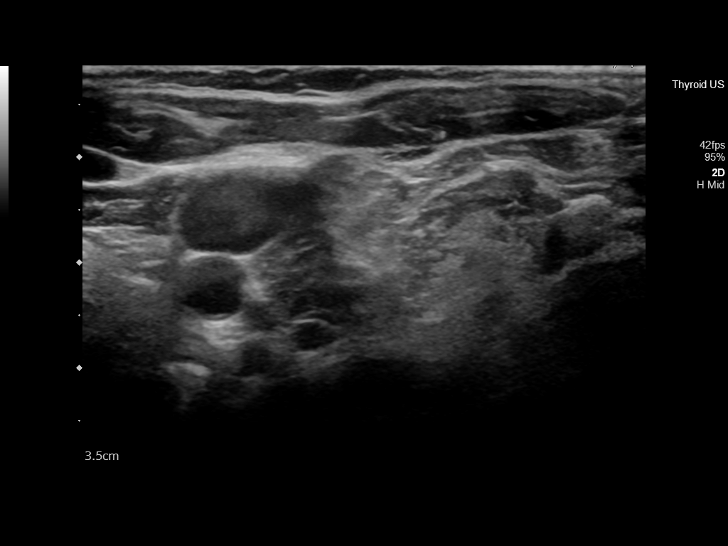

[14 of 25 positions shown; findings below may reference images not displayed]

FINDINGS: No residual thyroid tissue identified. No enlarged neck lymph nodes.
IMPRESSION: No evidence of recurrent thyroid malignancy.

The above is in keeping with the ACR TI-RADS recommendations - [HOSPITAL] 5450;[DATE].

## 2021-07-15 DIAGNOSIS — E890 Postprocedural hypothyroidism: Secondary | ICD-10-CM | POA: Diagnosis not present

## 2021-07-21 DIAGNOSIS — F4312 Post-traumatic stress disorder, chronic: Secondary | ICD-10-CM | POA: Diagnosis not present

## 2021-08-11 DIAGNOSIS — F4312 Post-traumatic stress disorder, chronic: Secondary | ICD-10-CM | POA: Diagnosis not present

## 2021-10-04 DIAGNOSIS — R911 Solitary pulmonary nodule: Secondary | ICD-10-CM | POA: Diagnosis not present

## 2021-10-06 DIAGNOSIS — F4312 Post-traumatic stress disorder, chronic: Secondary | ICD-10-CM | POA: Diagnosis not present

## 2021-10-20 DIAGNOSIS — F4312 Post-traumatic stress disorder, chronic: Secondary | ICD-10-CM | POA: Diagnosis not present

## 2021-11-01 DIAGNOSIS — E890 Postprocedural hypothyroidism: Secondary | ICD-10-CM | POA: Diagnosis not present

## 2021-11-01 DIAGNOSIS — Z85850 Personal history of malignant neoplasm of thyroid: Secondary | ICD-10-CM | POA: Diagnosis not present

## 2021-11-03 DIAGNOSIS — F4312 Post-traumatic stress disorder, chronic: Secondary | ICD-10-CM | POA: Diagnosis not present

## 2021-11-24 DIAGNOSIS — F4312 Post-traumatic stress disorder, chronic: Secondary | ICD-10-CM | POA: Diagnosis not present

## 2021-12-06 ENCOUNTER — Ambulatory Visit: Payer: BC Managed Care – PPO | Admitting: Sports Medicine

## 2021-12-26 DIAGNOSIS — Z86018 Personal history of other benign neoplasm: Secondary | ICD-10-CM | POA: Diagnosis not present

## 2021-12-26 DIAGNOSIS — Z872 Personal history of diseases of the skin and subcutaneous tissue: Secondary | ICD-10-CM | POA: Diagnosis not present

## 2021-12-26 DIAGNOSIS — L700 Acne vulgaris: Secondary | ICD-10-CM | POA: Diagnosis not present

## 2021-12-26 DIAGNOSIS — L578 Other skin changes due to chronic exposure to nonionizing radiation: Secondary | ICD-10-CM | POA: Diagnosis not present

## 2021-12-27 DIAGNOSIS — F4312 Post-traumatic stress disorder, chronic: Secondary | ICD-10-CM | POA: Diagnosis not present

## 2021-12-28 NOTE — Progress Notes (Signed)
? ?ANNUAL PREVENTATIVE CARE GYNECOLOGY  ENCOUNTER NOTE ? ?Subjective:  ?    ? Emma Ayers is a 56 y.o. G38P2002 female here for a routine annual gynecologic exam. Previously seen by Philip Aspen, CNM. She has a PMH of fibroid uterus, thyroid cancer s/p ablation. The patient is sexually active. The patient is not taking hormone replacement therapy. Patient denies post-menopausal vaginal bleeding. The patient wears seatbelts: yes. The patient participates in regular exercise: yes. Has the patient ever been transfused or tattooed?: no. The patient reports that there is not domestic violence in her life. ? ?Current complaints: ?1.  She reports a history of uterine fibroids. Notes that she was diagnosed ~ 10 years ago, was the size of a grapefruit at that time.  Last checked on ~ 5 years ago. Currently noting some incomplete bladder emptying. Unsure if this is related. Also has remote h/o ovarian cyst.  ?  ?Gynecologic History ?No LMP recorded. Patient is postmenopausal. ?Contraception: post menopausal status ?Last Pap: [id number]. Results were: normal ?Last mammogram: 01/20/2020. Results were: normal ?Last Colonoscopy: patient has never had one.  ? ? ? ?Obstetric History ?OB History  ?Gravida Para Term Preterm AB Living  ?2 2 2  0 0 2  ?SAB IAB Ectopic Multiple Live Births  ?0 0 0   2  ?  ?# Outcome Date GA Lbr Len/2nd Weight Sex Delivery Anes PTL Lv  ?2 Term 12/08/04   8 lb 7 oz (3.827 kg) F Vag-Spont  N LIV  ?1 Term 01/27/99   9 lb 3 oz (4.167 kg) F Vag-Spont  N LIV  ? ? ?Past Medical History:  ?Diagnosis Date  ? Anxiety   ? Cancer [facility])   ? thyroid and lung  ? Depression   ? Deviated septum   ? GERD (gastroesophageal reflux disease)   ? H/o Lyme disease   ? X2  ? History of West Nile virus (WNV) infection   ? Hypertension   ? Migraine   ? Ovarian cyst   ? Thyroid disease   ? Uterine fibroid   ? ? ?Family History  ?Problem Relation Age of Onset  ? Heart failure Mother   ? Breast cancer Mother 54  ? Heart disease  Mother   ? Depression Mother   ? Heart attack Father   ? Prostate cancer Father   ? Anxiety disorder Father   ? Heart disease Father   ? Drug abuse Sister   ? Colon polyps Sister   ? Depression Sister   ? Hypothyroidism Sister   ? Heart disease Brother   ? Depression Brother   ? Hypothyroidism Brother   ? Breast cancer Maternal Aunt   ? Breast cancer Maternal Aunt   ? ? ?Past Surgical History:  ?Procedure Laterality Date  ? radioactive iodine  2009  ? THYROIDECTOMY  2010  ? papillary thyroid cancer  ? uterine ablation  2011  ? ? ?Social History  ? ?Socioeconomic History  ? Marital status: Married  ?  Spouse name: Not on file  ? Number of children: 2  ? Years of education: Not on file  ? Highest education level: Not on file  ?Occupational History  ? Not on file  ?Tobacco Use  ? Smoking status: Never  ? Smokeless tobacco: Never  ?Vaping Use  ? Vaping Use: Never used  ?Substance and Sexual Activity  ? Alcohol use: Yes  ?  Alcohol/week: 1.0 standard drink  ?  Types: 1 Standard drinks or equivalent per week  ?  Drug use: Yes  ?  Frequency: 7.0 times per week  ?  Types: Marijuana  ?  Comment: sometimes at bedtime  ? Sexual activity: Not Currently  ?  Birth control/protection: Surgical  ?Other Topics Concern  ? Not on file  ?Social History Narrative  ? ** Merged History Encounter **  ?    ? ?Social Determinants of Health  ? ?Financial Resource Strain: Not on file  ?Food Insecurity: Not on file  ?Transportation Needs: Not on file  ?Physical Activity: Not on file  ?Stress: Not on file  ?Social Connections: Not on file  ?Intimate Partner Violence: Not on file  ? ? ?Current Outpatient Medications on File Prior to Visit  ?Medication Sig Dispense Refill  ? Cholecalciferol 50 MCG (2000 UT) TABS Take by mouth. As needed    ? FLUoxetine (PROZAC) 20 MG capsule Take 1 capsule (20 mg total) by mouth daily. 90 capsule 0  ? levothyroxine (SYNTHROID) 25 MCG tablet Take 25 mcg by mouth daily before breakfast.    ? LORazepam (ATIVAN) 1 MG  tablet Take 1 tablet (1 mg total) by mouth every 8 (eight) hours. 0.5-1 tablets as needed for anxiety 30 tablet 0  ? Multiple Vitamin (MULTIVITAMIN) capsule Take 1 capsule by mouth daily.    ? ondansetron (ZOFRAN) 8 MG tablet Take 0.5 tablets (4 mg total) by mouth every 8 (eight) hours as needed for nausea or vomiting. 30 tablet 0  ? SUMAtriptan (IMITREX) 6 MG/0.5ML SOSY injection Inject 0.5 mLs (6 mg total) into the skin every 2 (two) hours as needed for migraine or headache. F 9 mL 1  ? UNITHROID 100 MCG tablet TAKE 1 TABLET DAILY BEFORE BREAKFAST 90 tablet 0  ? ?No current facility-administered medications on file prior to visit.  ? ? ?Allergies  ?Allergen Reactions  ? Bee Venom Anaphylaxis  ? Benzocaine Nausea And Vomiting  ? Codeine Nausea Only  ? Latex Rash  ? ? ? ? ?Review of Systems ?ROS ?Review of Systems - General ROS: negative for - chills, fatigue, fever, hot flashes, night sweats, weight gain or weight loss ?Psychological ROS: negative for - anxiety, decreased libido, depression, mood swings, physical abuse or sexual abuse ?Ophthalmic ROS: negative for - blurry vision, eye pain or loss of vision ?ENT ROS: negative for - headaches, hearing change, visual changes or vocal changes ?Allergy and Immunology ROS: negative for - hives, itchy/watery eyes or seasonal allergies ?Hematological and Lymphatic ROS: negative for - bleeding problems, bruising, swollen lymph nodes or weight loss ?Endocrine ROS: negative for - galactorrhea, hair pattern changes, hot flashes, malaise/lethargy, mood swings, palpitations, polydipsia/polyuria, skin changes, temperature intolerance or unexpected weight changes ?Breast ROS: negative for - new or changing breast lumps or nipple discharge ?Respiratory ROS: negative for - cough or shortness of breath ?Cardiovascular ROS: negative for - chest pain, irregular heartbeat, palpitations or shortness of breath ?Gastrointestinal ROS: no abdominal pain, change in bowel habits, or black or  bloody stools ?Genito-Urinary ROS: no dysuria or hematuria. Positive for occasional incomplete bladder emptying and vaginal dryness (using lubricants which help).  ?Musculoskeletal ROS: negative for - joint pain or joint stiffness ?Neurological ROS: negative for - bowel and bladder control changes ?Dermatological ROS: negative for rash and skin lesion changes ?  ?Objective:  ? ?BP 107/61   Pulse 67   Resp 16   Ht 5\' 7"  (1.702 m)   Wt 165 lb 12.8 oz (75.2 kg)   BMI 25.97 kg/m?  ?CONSTITUTIONAL: Well-developed, well-nourished female in no  acute distress.  ?PSYCHIATRIC: Normal mood and affect. Normal behavior. Normal judgment and thought content. ?[place]: Alert and oriented to person, place, and time. Normal muscle tone coordination. No cranial nerve deficit noted. ?HENT:  Normocephalic, atraumatic, External right and left ear normal. Oropharynx is clear and moist ?EYES: Conjunctivae and EOM are normal. Pupils are equal, round, and reactive to light. No scleral icterus.  ?NECK: Normal range of motion, supple, no masses.  Normal thyroid.  ?SKIN: Skin is warm and dry. No rash noted. Not diaphoretic. No erythema. No pallor. ?CARDIOVASCULAR: Normal heart rate noted, regular rhythm, no murmur. ?RESPIRATORY: Clear to auscultation bilaterally. Effort and breath sounds normal, no problems with respiration noted. ?BREASTS: Symmetric in size. No masses, skin changes, nipple drainage, or lymphadenopathy. ?ABDOMEN: Soft, normal bowel sounds, no distention noted.  No tenderness, rebound or guarding.  ?BLADDER: Normal ?PELVIC: ? Bladder no bladder distension noted ? Urethra: normal appearing urethra with no masses, tenderness or lesions ? Vulva: normal appearing vulva with no masses, tenderness or lesions ? Vagina: normal appearing vagina without discharge, no lesions. Mildly atrophic. Small cystocele present.  ? Cervix: normal appearing cervix without discharge or lesions ? Uterus: uterus is normal size, shape, consistency  and nontender ? Adnexa: normal adnexa in size, nontender and no masses ? RV: External Exam NormaI and No Rectal Masses  ?MUSCULOSKELETAL: Normal range of motion. No tenderness.  No cyanosis, clubbing, or ed

## 2021-12-28 NOTE — Patient Instructions (Signed)
Breast Self-Awareness ?Breast self-awareness is knowing how your breasts look and feel. Doing breast self-awareness is important. It allows you to catch a breast problem early while it is still small and can be treated. All women should do breast self-awareness, including women who have had breast implants. Tell your doctor if you notice a change in your breasts. ?What you need: ?A mirror. ?A well-lit room. ?How to do a breast self-exam ?A breast self-exam is one way to learn what is normal for your breasts and to check for changes. To do a breast self-exam: ?Look for changes ? ?Take off all the clothes above your waist. ?Stand in front of a mirror in a room with good lighting. ?Put your hands on your hips. ?Push your hands down. ?Look at your breasts and nipples in the mirror to see if one breast or nipple looks different from the other. Check to see if: ?The shape of one breast is different. ?The size of one breast is different. ?There are wrinkles, dips, and bumps in one breast and not the other. ?Look at each breast for changes in the skin, such as: ?Redness. ?Scaly areas. ?Look for changes in your nipples, such as: ?Liquid around the nipples. ?Bleeding. ?Dimpling. ?Redness. ?A change in where the nipples are. ?Feel for changes ? ?Lie on your back on the floor. ?Feel each breast. To do this, follow these steps: ?Pick a breast to feel. ?Put the arm closest to that breast above your head. ?Use your other arm to feel the nipple area of your breast. Feel the area with the pads of your three middle fingers by making small circles with your fingers. For the first circle, press lightly. For the second circle, press harder. For the third circle, press even harder. ?Keep making circles with your fingers at the different pressures as you move down your breast. Stop when you feel your ribs. ?Move your fingers a little toward the center of your body. ?Start making circles with your fingers again, this time going up until  you reach your collarbone. ?Keep making up-and-down circles until you reach your armpit. Remember to keep using the three pressures. ?Feel the other breast in the same way. ?Sit or stand in the tub or shower. ?With soapy water on your skin, feel each breast the same way you did in step 2 when you were lying on the floor. ?Write down what you find ?Writing down what you find can help you remember what to tell your doctor. Write down: ?What is normal for each breast. ?Any changes you find in each breast, including: ?The kind of changes you find. ?Whether you have pain. ?Size and location of any lumps. ?When you last had your menstrual period. ?General tips ?Check your breasts every month. ?If you are breastfeeding, the best time to check your breasts is after you feed your baby or after you use a breast pump. ?If you get menstrual periods, the best time to check your breasts is 5-7 days after your menstrual period is over. ?With time, you will become comfortable with the self-exam, and you will begin to know if there are changes in your breasts. ?Contact a doctor if you: ?See a change in the shape or size of your breasts or nipples. ?See a change in the skin of your breast or nipples, such as red or scaly skin. ?Have fluid coming from your nipples that is not normal. ?Find a lump or thick area that was not there before. ?Have pain in   your breasts. ?Have any concerns about your breast health. ?Summary ?Breast self-awareness includes looking for changes in your breasts, as well as feeling for changes within your breasts. ?Breast self-awareness should be done in front of a mirror in a well-lit room. ?You should check your breasts every month. If you get menstrual periods, the best time to check your breasts is 5-7 days after your menstrual period is over. ?Let your doctor know of any changes you see in your breasts, including changes in size, changes on the skin, pain or tenderness, or fluid from your nipples that is not  normal. ?This information is not intended to replace advice given to you by your health care provider. Make sure you discuss any questions you have with your health care provider. ?Document Revised: 04/30/2018 Document Reviewed: 04/30/2018 ?Elsevier Patient Education ? 2022 [employer] Inc[employer] ?Preventive Care 40-64 Years Old, Female ?Preventive care refers to lifestyle choices and visits with your health care provider that can promote health and wellness. Preventive care visits are also called wellness exams. ?What can I expect for my preventive care visit? ?Counseling ?Your health care provider may ask you questions about your: ?Medical history, including: ?Past medical problems. ?Family medical history. ?Pregnancy history. ?Current health, including: ?Menstrual cycle. ?Method of birth control. ?Emotional well-being. ?Home life and relationship well-being. ?Sexual activity and sexual health. ?Lifestyle, including: ?Alcohol, nicotine or tobacco, and drug use. ?Access to firearms. ?Diet, exercise, and sleep habits. ?Work and work environment. ?Sunscreen use. ?Safety issues such as seatbelt and bike helmet use. ?Physical exam ?Your health care provider will check your: ?Height and weight. These may be used to calculate your BMI (body mass index). BMI is a measurement that tells if you are at a healthy weight. ?Waist circumference. This measures the distance around your waistline. This measurement also tells if you are at a healthy weight and may help predict your risk of certain diseases, such as type 2 diabetes and high blood pressure. ?Heart rate and blood pressure. ?Body temperature. ?Skin for abnormal spots. ?What immunizations do I need? ?Vaccines are usually given at various ages, according to a schedule. Your health care provider will recommend vaccines for you based on your age, medical history, and lifestyle or other factors, such as travel or where you work. ?What tests do I need? ?Screening ?Your health care  provider may recommend screening tests for certain conditions. This may include: ?Lipid and cholesterol levels. ?Diabetes screening. This is done by checking your blood sugar (glucose) after you have not eaten for a while (fasting). ?Pelvic exam and Pap test. ?Hepatitis B test. ?Hepatitis C test. ?HIV (human immunodeficiency virus) test. ?STI (sexually transmitted infection) testing, if you are at risk. ?Lung cancer screening. ?Colorectal cancer screening. ?Mammogram. Talk with your health care provider about when you should start having regular mammograms. This may depend on whether you have a family history of breast cancer. ?BRCA-related cancer screening. This may be done if you have a family history of breast, ovarian, tubal, or peritoneal cancers. ?Bone density scan. This is done to screen for osteoporosis. ?Talk with your health care provider about your test results, treatment options, and if necessary, the need for more tests. ?Follow these instructions at home: ?Eating and drinking ? ?Eat a diet that includes fresh fruits and vegetables, whole grains, lean protein, and low-fat dairy products. ?Take vitamin and mineral supplements as recommended by your health care provider. ?Do not drink alcohol if: ?Your health care provider tells you not to drink. ?You are pregnant,   may be pregnant, or are planning to become pregnant. ?If you drink alcohol: ?Limit how much you have to 0-1 drink a day. ?Know how much alcohol is in your drink. In the [place] one drink equals one 12 oz bottle of beer (355 mL), one 5 oz glass of wine (148 mL), or one 1? oz glass of hard liquor (44 mL). ?Lifestyle ?Brush your teeth every morning and night with fluoride toothpaste. Floss one time each day. ?Exercise for at least 30 minutes 5 or more days each week. ?Do not use any products that contain nicotine or tobacco. These products include cigarettes, chewing tobacco, and vaping devices, such as e-cigarettes. If you need help quitting, ask  your health care provider. ?Do not use drugs. ?If you are sexually active, practice safe sex. Use a condom or other form of protection to prevent STIs. ?If you do not wish to become pregnant, use a form of

## 2021-12-29 ENCOUNTER — Encounter: Payer: Self-pay | Admitting: Obstetrics and Gynecology

## 2021-12-29 ENCOUNTER — Ambulatory Visit (INDEPENDENT_AMBULATORY_CARE_PROVIDER_SITE_OTHER): Payer: BC Managed Care – PPO | Admitting: Obstetrics and Gynecology

## 2021-12-29 VITALS — BP 107/61 | HR 67 | Resp 16 | Ht 67.0 in | Wt 165.8 lb

## 2021-12-29 DIAGNOSIS — Z1231 Encounter for screening mammogram for malignant neoplasm of breast: Secondary | ICD-10-CM | POA: Diagnosis not present

## 2021-12-29 DIAGNOSIS — Z1211 Encounter for screening for malignant neoplasm of colon: Secondary | ICD-10-CM | POA: Diagnosis not present

## 2021-12-29 DIAGNOSIS — D259 Leiomyoma of uterus, unspecified: Secondary | ICD-10-CM | POA: Diagnosis not present

## 2021-12-29 DIAGNOSIS — Z01419 Encounter for gynecological examination (general) (routine) without abnormal findings: Secondary | ICD-10-CM | POA: Diagnosis not present

## 2021-12-29 DIAGNOSIS — R3914 Feeling of incomplete bladder emptying: Secondary | ICD-10-CM

## 2021-12-29 DIAGNOSIS — N951 Menopausal and female climacteric states: Secondary | ICD-10-CM

## 2022-01-09 ENCOUNTER — Other Ambulatory Visit: Payer: BC Managed Care – PPO

## 2022-01-17 DIAGNOSIS — F4312 Post-traumatic stress disorder, chronic: Secondary | ICD-10-CM | POA: Diagnosis not present

## 2022-02-06 DIAGNOSIS — F17200 Nicotine dependence, unspecified, uncomplicated: Secondary | ICD-10-CM | POA: Diagnosis not present

## 2022-02-06 DIAGNOSIS — R911 Solitary pulmonary nodule: Secondary | ICD-10-CM | POA: Diagnosis not present

## 2022-02-06 DIAGNOSIS — R918 Other nonspecific abnormal finding of lung field: Secondary | ICD-10-CM | POA: Diagnosis not present

## 2022-02-06 DIAGNOSIS — Z923 Personal history of irradiation: Secondary | ICD-10-CM | POA: Diagnosis not present

## 2022-02-06 DIAGNOSIS — C3491 Malignant neoplasm of unspecified part of right bronchus or lung: Secondary | ICD-10-CM | POA: Diagnosis not present

## 2022-02-14 DIAGNOSIS — F4312 Post-traumatic stress disorder, chronic: Secondary | ICD-10-CM | POA: Diagnosis not present

## 2022-03-17 DIAGNOSIS — F4312 Post-traumatic stress disorder, chronic: Secondary | ICD-10-CM | POA: Diagnosis not present

## 2022-03-24 DIAGNOSIS — F4312 Post-traumatic stress disorder, chronic: Secondary | ICD-10-CM | POA: Diagnosis not present

## 2022-03-27 DIAGNOSIS — F4312 Post-traumatic stress disorder, chronic: Secondary | ICD-10-CM | POA: Diagnosis not present

## 2022-03-30 DIAGNOSIS — E890 Postprocedural hypothyroidism: Secondary | ICD-10-CM | POA: Diagnosis not present

## 2022-04-04 DIAGNOSIS — F4312 Post-traumatic stress disorder, chronic: Secondary | ICD-10-CM | POA: Diagnosis not present

## 2022-04-10 DIAGNOSIS — T6391XS Toxic effect of contact with unspecified venomous animal, accidental (unintentional), sequela: Secondary | ICD-10-CM | POA: Diagnosis not present

## 2022-04-10 DIAGNOSIS — E890 Postprocedural hypothyroidism: Secondary | ICD-10-CM | POA: Diagnosis not present

## 2022-04-10 DIAGNOSIS — G43709 Chronic migraine without aura, not intractable, without status migrainosus: Secondary | ICD-10-CM | POA: Diagnosis not present

## 2022-04-10 DIAGNOSIS — Z85850 Personal history of malignant neoplasm of thyroid: Secondary | ICD-10-CM | POA: Diagnosis not present

## 2022-05-04 DIAGNOSIS — F4312 Post-traumatic stress disorder, chronic: Secondary | ICD-10-CM | POA: Diagnosis not present

## 2022-05-19 DIAGNOSIS — F4312 Post-traumatic stress disorder, chronic: Secondary | ICD-10-CM | POA: Diagnosis not present

## 2022-05-24 DIAGNOSIS — F4312 Post-traumatic stress disorder, chronic: Secondary | ICD-10-CM | POA: Diagnosis not present

## 2022-06-20 ENCOUNTER — Encounter: Payer: Self-pay | Admitting: Internal Medicine

## 2022-06-21 ENCOUNTER — Encounter: Payer: Self-pay | Admitting: Internal Medicine

## 2022-06-21 ENCOUNTER — Ambulatory Visit: Payer: BC Managed Care – PPO | Admitting: Internal Medicine

## 2022-06-21 VITALS — BP 104/82 | HR 61 | Ht 68.0 in | Wt 163.0 lb

## 2022-06-21 DIAGNOSIS — Z85850 Personal history of malignant neoplasm of thyroid: Secondary | ICD-10-CM | POA: Diagnosis not present

## 2022-06-21 DIAGNOSIS — F321 Major depressive disorder, single episode, moderate: Secondary | ICD-10-CM | POA: Diagnosis not present

## 2022-06-21 DIAGNOSIS — F4312 Post-traumatic stress disorder, chronic: Secondary | ICD-10-CM | POA: Diagnosis not present

## 2022-06-21 DIAGNOSIS — C3491 Malignant neoplasm of unspecified part of right bronchus or lung: Secondary | ICD-10-CM

## 2022-06-21 DIAGNOSIS — G43009 Migraine without aura, not intractable, without status migrainosus: Secondary | ICD-10-CM | POA: Diagnosis not present

## 2022-06-21 MED ORDER — ONDANSETRON HCL 8 MG PO TABS: 4.0000 mg | ORAL_TABLET | Freq: Three times a day (TID) | ORAL | 0 refills | Status: DC | PRN

## 2022-06-21 MED ORDER — LORAZEPAM 1 MG PO TABS: 1.0000 mg | ORAL_TABLET | Freq: Three times a day (TID) | ORAL | 0 refills | Status: DC
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
## 2022-06-21 NOTE — Progress Notes (Signed)
Date:  06/21/2022   Name:  Emma Ayers   DOB:  10/24/1965   MRN:  [id number]   Chief Complaint: No chief complaint on file.  HPI  Lab Results  Component Value Date   NA 142 12/29/2019   K 4.1 12/29/2019   CO2 24 12/29/2019   GLUCOSE 89 12/29/2019   BUN 11 12/29/2019   CREATININE 0.85 12/29/2019   CALCIUM 9.6 12/29/2019   GFRNONAA 78 12/29/2019   Lab Results  Component Value Date   CHOL 156 01/27/2020   HDL 71 01/27/2020   LDLCALC 71 01/27/2020   TRIG 74 01/27/2020   CHOLHDL 2.2 01/27/2020   Lab Results  Component Value Date   TSH 4.770 (H) 12/29/2019   No results found for: "HGBA1C" Lab Results  Component Value Date   WBC 4.8 12/29/2019   HGB 13.4 12/29/2019   HCT 38.8 12/29/2019   MCV 86 12/29/2019   PLT 253 12/29/2019   Lab Results  Component Value Date   ALT 17 12/29/2019   AST 21 12/29/2019   ALKPHOS 59 12/29/2019   BILITOT 0.5 12/29/2019   Lab Results  Component Value Date   VD25OH 45.2 12/29/2019     Review of Systems  Patient Active Problem List   Diagnosis Date Noted   History of thyroid cancer 12/29/2019   Anxiety disorder 12/29/2019   Migraine without aura, not intractable 12/29/2019   Cervical herniated disc 12/29/2019   Uterine fibroid 12/29/2019   Depression, major, single episode, moderate (HCC) 12/29/2019   Vitamin D deficiency 12/29/2019   Nasal septal deviation 12/29/2019   Hepatic steatosis 12/29/2019   Ovarian cyst, right 12/29/2019   Adenocarcinoma of right lung ([place]) 10/13/2019    Allergies  Allergen Reactions   Bee Venom Anaphylaxis   Benzocaine Nausea And Vomiting   Codeine Nausea Only   Latex Rash    Past Surgical History:  Procedure Laterality Date   radioactive iodine  2009   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   uterine ablation  2011    Social History   Tobacco Use   Smoking status: Never   Smokeless tobacco: Never  Vaping Use   Vaping Use: Never used  Substance Use Topics    Alcohol use: Yes    Alcohol/week: 1.0 standard drink of alcohol    Types: 1 Standard drinks or equivalent per week   Drug use: Yes    Frequency: 7.0 times per week    Types: Marijuana    Comment: sometimes at bedtime     Medication list has been reviewed and updated.  No outpatient medications have been marked as taking for the 06/21/22 encounter (Office Visit) with Glean Hess, MD.       05/31/2021    3:32 PM 11/19/2020    8:30 AM 06/28/2020    3:09 PM 02/26/2020   10:52 AM  GAD 7 : Generalized Anxiety Score  Nervous, Anxious, on Edge 0 0 0 0  Control/stop worrying 0 0 0 0  Worry too much - different things 0 0 0 0  Trouble relaxing 0 0 0 0  Restless 0 0 0 0  Easily annoyed or irritable 0 0 0 0  Afraid - awful might happen 0 0 0 0  Total GAD 7 Score 0 0 0 0  Anxiety Difficulty   Not difficult at all Not difficult at all       05/31/2021    3:32 PM 11/19/2020    8:30  AM 06/28/2020    3:02 PM  Depression screen PHQ 2/9  Decreased Interest 0 0 0  Down, Depressed, Hopeless 0 0 0  PHQ - 2 Score 0 0 0  Altered sleeping 1 0 0  Tired, decreased energy 0 0 0  Change in appetite 0 0 0  Feeling bad or failure about yourself  0 0 0  Trouble concentrating 0 0 0  Moving slowly or fidgety/restless 0 0 0  Suicidal thoughts  0 0  PHQ-9 Score 1 0 0  Difficult doing work/chores Not difficult at all  Not difficult at all    BP Readings from Last 3 Encounters:  12/29/21 107/61  05/31/21 104/78  11/19/20 110/88    Physical Exam  Wt Readings from Last 3 Encounters:  12/29/21 165 lb 12.8 oz (75.2 kg)  05/31/21 169 lb (76.7 kg)  11/19/20 165 lb (74.8 kg)    Ht 5\' 8"  (1.727 m)   BMI 25.21 kg/m   Assessment and Plan:

## 2022-07-07 DIAGNOSIS — F4312 Post-traumatic stress disorder, chronic: Secondary | ICD-10-CM | POA: Diagnosis not present

## 2022-07-10 DIAGNOSIS — F4312 Post-traumatic stress disorder, chronic: Secondary | ICD-10-CM | POA: Diagnosis not present

## 2022-07-10 DIAGNOSIS — E890 Postprocedural hypothyroidism: Secondary | ICD-10-CM | POA: Diagnosis not present

## 2022-07-11 DIAGNOSIS — E890 Postprocedural hypothyroidism: Secondary | ICD-10-CM | POA: Diagnosis not present

## 2022-07-11 DIAGNOSIS — Z85850 Personal history of malignant neoplasm of thyroid: Secondary | ICD-10-CM | POA: Diagnosis not present

## 2022-07-11 DIAGNOSIS — G43709 Chronic migraine without aura, not intractable, without status migrainosus: Secondary | ICD-10-CM | POA: Diagnosis not present

## 2022-08-03 DIAGNOSIS — F4312 Post-traumatic stress disorder, chronic: Secondary | ICD-10-CM | POA: Diagnosis not present

## 2022-08-15 ENCOUNTER — Other Ambulatory Visit: Payer: Self-pay | Admitting: Internal Medicine

## 2022-08-15 MED ORDER — SUMATRIPTAN SUCCINATE 6 MG/0.5ML ~~LOC~~ SOSY: 6.0000 mg | PREFILLED_SYRINGE | SUBCUTANEOUS | 0 refills | Status: AC | PRN

## 2022-08-15 NOTE — Telephone Encounter (Signed)
Requested medication (s) are due for refill today: routing for review.  Requested medication (s) are on the active medication list: yes  Last refill:  09/01/20  Future visit scheduled: yes  Notes to clinic:  Unable to refill per protocol, routing for review, last RF 09/01/20.     Requested Prescriptions  Pending Prescriptions Disp Refills   SUMAtriptan (IMITREX) 6 MG/0.5ML SOSY injection 9 mL 1    Sig: Inject 0.5 mLs (6 mg total) into the skin every 2 (two) hours as needed for migraine or headache. F     Neurology:  Migraine Therapy - Triptan Passed - 08/15/2022 11:02 AM      Passed - Last BP in normal range    BP Readings from Last 1 Encounters:  06/21/22 104/82         Passed - Valid encounter within last 12 months    Recent Outpatient Visits           1 month ago Migraine without aura and without status migrainosus, not intractable   [facility], Jesse Sans, MD   1 year ago Cellulitis of groin   [facility] at [facility], Jesse Sans, MD   1 year ago History of thyroid cancer   [facility] at [facility], Jesse Sans, MD   2 years ago Acute cystitis without hematuria   [facility] at [facility], Jesse Sans, MD   2 years ago Irritant contact dermatitis due to plants, except food   [place] [facility] at [facility], Jesse Sans, MD       Future Appointments             In 2 months Army Melia, Jesse Sans, MD [place] [facility] at [facility], [facility]   In 5 months Rubie Maid, [place]

## 2022-08-15 NOTE — Telephone Encounter (Signed)
Medication Refill - Medication: SUMAtriptan (IMITREX) 6 MG/0.5ML SOSY injection  Pt stated needs the auto injector device if not included in this prescription.   Has the patient contacted their pharmacy? Yes.   Stated pharmacy advised her no Rx .   (Agent: If yes, when and what did the pharmacy advise?)  Preferred Pharmacy (with phone number or street name):  [facility] [address] [place]  [place] [address]  Phone: [phone] Fax: [phone]  Hours: Not open 24 hours   Has the patient been seen for an appointment in the last year OR does the patient have an upcoming appointment? Yes.    Agent: Please be advised that RX refills may take up to 3 business days. We ask that you follow-up with your pharmacy.

## 2022-09-07 DIAGNOSIS — F4312 Post-traumatic stress disorder, chronic: Secondary | ICD-10-CM | POA: Diagnosis not present

## 2022-09-12 DIAGNOSIS — Z85850 Personal history of malignant neoplasm of thyroid: Secondary | ICD-10-CM | POA: Diagnosis not present

## 2022-09-12 DIAGNOSIS — E039 Hypothyroidism, unspecified: Secondary | ICD-10-CM | POA: Diagnosis not present

## 2022-09-12 DIAGNOSIS — G43909 Migraine, unspecified, not intractable, without status migrainosus: Secondary | ICD-10-CM | POA: Diagnosis not present

## 2022-09-12 DIAGNOSIS — R918 Other nonspecific abnormal finding of lung field: Secondary | ICD-10-CM | POA: Diagnosis not present

## 2022-09-13 DIAGNOSIS — F4312 Post-traumatic stress disorder, chronic: Secondary | ICD-10-CM | POA: Diagnosis not present

## 2022-09-29 DIAGNOSIS — F4312 Post-traumatic stress disorder, chronic: Secondary | ICD-10-CM | POA: Diagnosis not present

## 2022-10-12 DIAGNOSIS — F4312 Post-traumatic stress disorder, chronic: Secondary | ICD-10-CM | POA: Diagnosis not present

## 2022-10-24 DIAGNOSIS — F4312 Post-traumatic stress disorder, chronic: Secondary | ICD-10-CM | POA: Diagnosis not present

## 2022-10-25 ENCOUNTER — Ambulatory Visit (INDEPENDENT_AMBULATORY_CARE_PROVIDER_SITE_OTHER): Payer: BC Managed Care – PPO | Admitting: Internal Medicine

## 2022-10-25 ENCOUNTER — Encounter: Payer: Self-pay | Admitting: Internal Medicine

## 2022-10-25 VITALS — BP 124/78 | HR 78 | Ht 68.0 in | Wt 151.0 lb

## 2022-10-25 DIAGNOSIS — Z131 Encounter for screening for diabetes mellitus: Secondary | ICD-10-CM | POA: Diagnosis not present

## 2022-10-25 DIAGNOSIS — G43009 Migraine without aura, not intractable, without status migrainosus: Secondary | ICD-10-CM

## 2022-10-25 DIAGNOSIS — F321 Major depressive disorder, single episode, moderate: Secondary | ICD-10-CM

## 2022-10-25 DIAGNOSIS — K760 Fatty (change of) liver, not elsewhere classified: Secondary | ICD-10-CM | POA: Diagnosis not present

## 2022-10-25 DIAGNOSIS — C3491 Malignant neoplasm of unspecified part of right bronchus or lung: Secondary | ICD-10-CM

## 2022-10-25 DIAGNOSIS — E890 Postprocedural hypothyroidism: Secondary | ICD-10-CM | POA: Diagnosis not present

## 2022-10-25 DIAGNOSIS — Z0000 Encounter for general adult medical examination without abnormal findings: Secondary | ICD-10-CM

## 2022-10-25 DIAGNOSIS — Z13220 Encounter for screening for lipoid disorders: Secondary | ICD-10-CM

## 2022-10-25 DIAGNOSIS — Z13820 Encounter for screening for osteoporosis: Secondary | ICD-10-CM

## 2022-10-25 DIAGNOSIS — Z85850 Personal history of malignant neoplasm of thyroid: Secondary | ICD-10-CM | POA: Diagnosis not present

## 2022-10-25 DIAGNOSIS — N951 Menopausal and female climacteric states: Secondary | ICD-10-CM

## 2022-10-25 MED ORDER — LORAZEPAM 1 MG PO TABS: 1.0000 mg | ORAL_TABLET | Freq: Three times a day (TID) | ORAL | 0 refills | Status: AC

## 2022-10-25 MED ORDER — ONDANSETRON HCL 8 MG PO TABS: 4.0000 mg | ORAL_TABLET | Freq: Three times a day (TID) | ORAL | 0 refills | Status: AC | PRN

## 2022-10-25 NOTE — Assessment & Plan Note (Addendum)
Followed by ENT Dr. Kathyrn Sheriff On Levothyroxine

## 2022-10-25 NOTE — Assessment & Plan Note (Signed)
Followed by [facility] with annual CTs

## 2022-10-25 NOTE — Assessment & Plan Note (Addendum)
No recent change in the character or frequency of headaches. Headaches respond well to current therapy with Imitrex injections. She is concerned about stroke risk with Triptans and would like to try Nurtec - will give samples

## 2022-10-25 NOTE — Progress Notes (Signed)
Date:  10/25/2022   Name:  Emma Ayers   DOB:  1966/03/04   MRN:  [id number]   Chief Complaint: Annual Exam Emma Ayers is a 57 y.o. female who presents today for her Complete Annual Exam. She feels fairly well. She reports exercising. She reports she is sleeping fairly well. Breast complaints none.  She remains under extreme stress dealing with her 31 y/o daughter with extensive health issues related to dysautonomia after collar bone fracture.  She also struggles with similar symptoms herself but manages with non medical techniques.  Mammogram: scheduled 11/14/22 DEXA: none Pap smear: 01/2020 neg/neg  Dr. Marcelline Mates Colonoscopy: 12/2017 repeat 10 yrs  Health Maintenance Due  Topic Date Due   COVID-19 Vaccine (1) Never done   Hepatitis C Screening  Never done   MAMMOGRAM  01/19/2022    Immunization History  Administered Date(s) Administered   Tdap 01/27/2020   Zoster Recombinat (Shingrix) 07/25/2019, 12/29/2019    HPI  Lab Results  Component Value Date   NA 142 12/29/2019   K 4.1 12/29/2019   CO2 24 12/29/2019   GLUCOSE 89 12/29/2019   BUN 11 12/29/2019   CREATININE 0.85 12/29/2019   CALCIUM 9.6 12/29/2019   GFRNONAA 78 12/29/2019   Lab Results  Component Value Date   CHOL 156 01/27/2020   HDL 71 01/27/2020   LDLCALC 71 01/27/2020   TRIG 74 01/27/2020   CHOLHDL 2.2 01/27/2020   Lab Results  Component Value Date   TSH 4.770 (H) 12/29/2019   No results found for: "HGBA1C" Lab Results  Component Value Date   WBC 4.8 12/29/2019   HGB 13.4 12/29/2019   HCT 38.8 12/29/2019   MCV 86 12/29/2019   PLT 253 12/29/2019   Lab Results  Component Value Date   ALT 17 12/29/2019   AST 21 12/29/2019   ALKPHOS 59 12/29/2019   BILITOT 0.5 12/29/2019   Lab Results  Component Value Date   VD25OH 45.2 12/29/2019     Review of Systems  Constitutional:  Negative for chills, fatigue, fever and unexpected weight change.  HENT:  Negative for congestion, hearing  loss, tinnitus and trouble swallowing.   Eyes:  Negative for visual disturbance.  Respiratory:  Positive for stridor. Negative for cough, chest tightness, shortness of breath and wheezing.   Cardiovascular:  Negative for chest pain, palpitations and leg swelling.  Gastrointestinal:  Negative for abdominal pain, constipation, diarrhea and vomiting.  Endocrine: Negative for polydipsia and polyuria.  Genitourinary:  Negative for dysuria, frequency, vaginal bleeding and vaginal discharge.  Musculoskeletal:  Positive for neck pain. Negative for arthralgias, gait problem and joint swelling.  Skin:  Negative for color change and rash.  Neurological:  Positive for light-headedness and headaches. Negative for dizziness and tremors.  Hematological:  Negative for adenopathy. Does not bruise/bleed easily.  Psychiatric/Behavioral:  Positive for dysphoric mood. Negative for sleep disturbance. The patient is nervous/anxious.     Patient Active Problem List   Diagnosis Date Noted   Post menopausal syndrome 10/25/2022   History of thyroid cancer 12/29/2019   Anxiety disorder 12/29/2019   Migraine without aura, not intractable 12/29/2019   Cervical herniated disc 12/29/2019   Uterine fibroid 12/29/2019   Depression, major, single episode, moderate ([place]) 12/29/2019   Vitamin D deficiency 12/29/2019   Nasal septal deviation 12/29/2019   Hepatic steatosis 12/29/2019   Ovarian cyst, right 12/29/2019   Adenocarcinoma of right lung ([place]) 10/13/2019    Allergies  Allergen Reactions   Bee Venom  Anaphylaxis    Has epi pen   Benzocaine Nausea And Vomiting   Codeine Nausea Only   Latex Rash   Tape Rash    Past Surgical History:  Procedure Laterality Date   radioactive iodine  2009   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   uterine ablation  2011    Social History   Tobacco Use   Smoking status: Never   Smokeless tobacco: Never  Vaping Use   Vaping Use: Never used  Substance Use  Topics   Alcohol use: Yes    Alcohol/week: 1.0 standard drink of alcohol    Types: 1 Standard drinks or equivalent per week   Drug use: Yes    Frequency: 7.0 times per week    Types: Marijuana    Comment: sometimes at bedtime     Medication list has been reviewed and updated.  Current Meds  Medication Sig   Cholecalciferol 50 MCG (2000 UT) TABS Take by mouth. As needed   FLUoxetine (PROZAC) 20 MG capsule Take 20 mg by mouth daily.   Levothyroxine Sodium 112 MCG CAPS Take 25 mcg by mouth daily before breakfast. Alternates between 112 and 125 based on weight.   Multiple Vitamin (MULTIVITAMIN) capsule Take 1 capsule by mouth daily.   SUMAtriptan (IMITREX) 6 MG/0.5ML SOSY injection Inject 0.5 mLs (6 mg total) into the skin every 2 (two) hours as needed for migraine or headache. F   SYNTHROID 125 MCG tablet Take 125 mcg by mouth daily. Pt takes 112MCG   [DISCONTINUED] LORazepam (ATIVAN) 1 MG tablet Take 1 tablet (1 mg total) by mouth every 8 (eight) hours. 0.5-1 tablets as needed for anxiety   [DISCONTINUED] ondansetron (ZOFRAN) 8 MG tablet Take 0.5 tablets (4 mg total) by mouth every 8 (eight) hours as needed for nausea or vomiting.       10/25/2022    9:43 AM 05/31/2021    3:32 PM 11/19/2020    8:30 AM 06/28/2020    3:09 PM  GAD 7 : Generalized Anxiety Score  Nervous, Anxious, on Edge 1 0 0 0  Control/stop worrying 0 0 0 0  Worry too much - different things 1 0 0 0  Trouble relaxing 1 0 0 0  Restless 1 0 0 0  Easily annoyed or irritable 1 0 0 0  Afraid - awful might happen 0 0 0 0  Total GAD 7 Score 5 0 0 0  Anxiety Difficulty Not difficult at all   Not difficult at all       10/25/2022    9:42 AM 05/31/2021    3:32 PM 11/19/2020    8:30 AM  Depression screen PHQ 2/9  Decreased Interest 0 0 0  Down, Depressed, Hopeless 0 0 0  PHQ - 2 Score 0 0 0  Altered sleeping 2 1 0  Tired, decreased energy 1 0 0  Change in appetite 1 0 0  Feeling bad or failure about yourself  0 0 0   Trouble concentrating 1 0 0  Moving slowly or fidgety/restless 2 0 0  Suicidal thoughts 0  0  PHQ-9 Score 7 1 0  Difficult doing work/chores Not difficult at all Not difficult at all     BP Readings from Last 3 Encounters:  10/25/22 124/78  06/21/22 104/82  12/29/21 107/61    Physical Exam Vitals and nursing note reviewed.  Constitutional:      General: She is not in acute distress.    Appearance: Normal  appearance. She is well-developed.  HENT:     Head: Normocephalic and atraumatic.     Right Ear: Tympanic membrane and ear canal normal.     Left Ear: Tympanic membrane and ear canal normal.     Nose:     Right Sinus: No maxillary sinus tenderness.     Left Sinus: No maxillary sinus tenderness.  Eyes:     General: No scleral icterus.       Right eye: No discharge.        Left eye: No discharge.     Conjunctiva/sclera: Conjunctivae normal.  Neck:     Thyroid: No thyromegaly.     Vascular: No carotid bruit.  Cardiovascular:     Rate and Rhythm: Normal rate and regular rhythm.     Pulses: Normal pulses.     Heart sounds: Normal heart sounds.  Pulmonary:     Effort: Pulmonary effort is normal. No respiratory distress.     Breath sounds: No wheezing.  Chest:  Breasts:    Right: No mass, nipple discharge, skin change or tenderness.     Left: No mass, nipple discharge, skin change or tenderness.  Abdominal:     General: Bowel sounds are normal.     Palpations: Abdomen is soft.     Tenderness: There is no abdominal tenderness.  Musculoskeletal:     Cervical back: Normal range of motion. No erythema.     Right lower leg: No edema.     Left lower leg: No edema.  Lymphadenopathy:     Cervical: No cervical adenopathy.  Skin:    General: Skin is warm and dry.     Capillary Refill: Capillary refill takes less than 2 seconds.     Findings: No rash.  Neurological:     General: No focal deficit present.     Mental Status: She is alert and oriented to person, place, and  time.     Cranial Nerves: No cranial nerve deficit.     Sensory: No sensory deficit.     Deep Tendon Reflexes: Reflexes are normal and symmetric.  Psychiatric:        Attention and Perception: Attention normal.        Mood and Affect: Mood normal.        Behavior: Behavior normal.     Wt Readings from Last 3 Encounters:  10/25/22 151 lb (68.5 kg)  06/21/22 163 lb (73.9 kg)  12/29/21 165 lb 12.8 oz (75.2 kg)    BP 124/78   Pulse 78   Ht 5\' 8"  (1.727 m)   Wt 151 lb (68.5 kg)   SpO2 98%   BMI 22.96 kg/m   Assessment and Plan: Problem List Items Addressed This Visit       Cardiovascular and Mediastinum   Migraine without aura, not intractable (Chronic)    No recent change in the character or frequency of headaches. Headaches respond well to current therapy with Imitrex injections. She is concerned about stroke risk with Triptans and would like to try Nurtec - will give samples       Relevant Medications   FLUoxetine (PROZAC) 20 MG capsule   ondansetron (ZOFRAN) 8 MG tablet   LORazepam (ATIVAN) 1 MG tablet     Respiratory   Adenocarcinoma of right lung (HCC) (Chronic)    Followed by [facility] with annual CTs      Relevant Medications   ondansetron (ZOFRAN) 8 MG tablet   LORazepam (ATIVAN) 1 MG tablet   Other  Relevant Orders   CBC with Differential/Platelet     Digestive   Hepatic steatosis   Relevant Orders   Comprehensive metabolic panel   Hepatitis C antibody     Other   Post menopausal syndrome    She is planning to see Endo for hormonal issues but requests hormone levels today      Relevant Orders   Estrogens, total   Testosterone   17-Hydroxyprogesterone   Depression, major, single episode, moderate (HCC) (Chronic)   Relevant Medications   FLUoxetine (PROZAC) 20 MG capsule   LORazepam (ATIVAN) 1 MG tablet   History of thyroid cancer (Chronic)    Followed by ENT Dr. Kathyrn Sheriff On Levothyroxine      Other Visit Diagnoses     Annual physical exam     -  Primary   Relevant Orders   CBC with Differential/Platelet   Comprehensive metabolic panel   Lipid panel   Hemoglobin A1c   Hepatitis C antibody   Screening for diabetes mellitus       Relevant Orders   Hemoglobin A1c   Screening for lipid disorders       Relevant Orders   Lipid panel   Encounter for screening for osteoporosis       last vitamin D was normal she continues on daily supplement DEXA ordered but she may not be able to afford for a while   Relevant Orders   DG Bone Density        Partially dictated using Editor, commissioning. Any errors are unintentional.  Halina Maidens, MD [facility]  10/25/2022

## 2022-10-25 NOTE — Assessment & Plan Note (Signed)
She is planning to see Endo for hormonal issues but requests hormone levels today

## 2022-10-25 NOTE — Patient Instructions (Signed)
Call [facility] to schedule your Bone Density (DEXA) at [phone].

## 2022-10-28 LAB — CBC WITH DIFFERENTIAL/PLATELET
Basophils Absolute: 0.1 10*3/uL (ref 0.0–0.2)
Basos: 1 %
EOS (ABSOLUTE): 0 10*3/uL (ref 0.0–0.4)
Eos: 1 %
Hematocrit: 39.3 % (ref 34.0–46.6)
Hemoglobin: 13.5 g/dL (ref 11.1–15.9)
Immature Grans (Abs): 0 10*3/uL (ref 0.0–0.1)
Immature Granulocytes: 0 %
Lymphocytes Absolute: 1.7 10*3/uL (ref 0.7–3.1)
Lymphs: 36 %
MCH: 28.6 pg (ref 26.6–33.0)
MCHC: 34.4 g/dL (ref 31.5–35.7)
MCV: 83 fL (ref 79–97)
Monocytes Absolute: 0.4 10*3/uL (ref 0.1–0.9)
Monocytes: 8 %
Neutrophils Absolute: 2.6 10*3/uL (ref 1.4–7.0)
Neutrophils: 54 %
Platelets: 298 10*3/uL (ref 150–450)
RBC: 4.72 x10E6/uL (ref 3.77–5.28)
RDW: 12.8 % (ref 11.7–15.4)
WBC: 4.7 10*3/uL (ref 3.4–10.8)

## 2022-10-28 LAB — COMPREHENSIVE METABOLIC PANEL
ALT: 17 IU/L (ref 0–32)
AST: 14 IU/L (ref 0–40)
Albumin/Globulin Ratio: 1.9 (ref 1.2–2.2)
Albumin: 4.7 g/dL (ref 3.8–4.9)
Alkaline Phosphatase: 65 IU/L (ref 44–121)
BUN/Creatinine Ratio: 18 (ref 9–23)
BUN: 13 mg/dL (ref 6–24)
Bilirubin Total: 0.5 mg/dL (ref 0.0–1.2)
CO2: 22 mmol/L (ref 20–29)
Calcium: 9.6 mg/dL (ref 8.7–10.2)
Chloride: 101 mmol/L (ref 96–106)
Creatinine, Ser: 0.72 mg/dL (ref 0.57–1.00)
Globulin, Total: 2.5 g/dL (ref 1.5–4.5)
Glucose: 95 mg/dL (ref 70–99)
Potassium: 4 mmol/L (ref 3.5–5.2)
Sodium: 139 mmol/L (ref 134–144)
Total Protein: 7.2 g/dL (ref 6.0–8.5)
eGFR: 98 mL/min/{1.73_m2} (ref >59)

## 2022-10-28 LAB — ESTROGENS, TOTAL: Estrogen: 83 pg/mL (ref 40–244)

## 2022-10-28 LAB — LIPID PANEL
Chol/HDL Ratio: 2.7 ratio (ref 0.0–4.4)
Cholesterol, Total: 182 mg/dL (ref 100–199)
HDL: 68 mg/dL (ref >39)
LDL Chol Calc (NIH): 105 mg/dL — ABNORMAL HIGH (ref 0–99)
Triglycerides: 43 mg/dL (ref 0–149)
VLDL Cholesterol Cal: 9 mg/dL (ref 5–40)

## 2022-10-28 LAB — HEMOGLOBIN A1C
Est. average glucose Bld gHb Est-mCnc: 114 mg/dL
Hgb A1c MFr Bld: 5.6 % (ref 4.8–5.6)

## 2022-10-28 LAB — 17-HYDROXYPROGESTERONE: 17-OH Progesterone LCMS: 20 ng/dL

## 2022-10-28 LAB — HEPATITIS C ANTIBODY: Hep C Virus Ab: NONREACTIVE

## 2022-10-28 LAB — TESTOSTERONE: Testosterone: 5 ng/dL (ref 4–50)

## 2022-10-31 DIAGNOSIS — Z85850 Personal history of malignant neoplasm of thyroid: Secondary | ICD-10-CM | POA: Diagnosis not present

## 2022-10-31 DIAGNOSIS — E890 Postprocedural hypothyroidism: Secondary | ICD-10-CM | POA: Diagnosis not present

## 2022-10-31 DIAGNOSIS — J342 Deviated nasal septum: Secondary | ICD-10-CM | POA: Diagnosis not present

## 2022-11-07 DIAGNOSIS — F4312 Post-traumatic stress disorder, chronic: Secondary | ICD-10-CM | POA: Diagnosis not present

## 2022-11-18 LAB — COLOGUARD

## 2022-11-21 ENCOUNTER — Telehealth: Payer: Self-pay | Admitting: Internal Medicine

## 2022-11-21 DIAGNOSIS — F4312 Post-traumatic stress disorder, chronic: Secondary | ICD-10-CM | POA: Diagnosis not present

## 2022-11-21 NOTE — Telephone Encounter (Signed)
Copied from [place] [id number]. Topic: General - Inquiry >> Nov 21, 2022  2:00 PM Marcellus Scott wrote: Reason for CRM: Pt called in requesting to speak with Dr.Berglunds nurse; she stated she needs recent labs printed in hard copy and mailed to her home address. Verified home address on file is correct.  Per the pt request provided, MyChart Patient Support phone number [phone] stated she has had altercations with the office manager and is given restrictions that she not be allowed to access her chart.   She stated that her mychart is locked because she is in litigation with [facility].  Please advise.

## 2022-11-21 NOTE — Telephone Encounter (Signed)
Labs printed and mailed.  KP

## 2022-11-27 ENCOUNTER — Ambulatory Visit
Admission: RE | Admit: 2022-11-27 | Discharge: 2022-11-27 | Disposition: A | Discharge status: Home / Self Care | Payer: BC Managed Care – PPO | Source: Ambulatory Visit | Attending: Obstetrics and Gynecology | Admitting: Obstetrics and Gynecology

## 2022-11-27 DIAGNOSIS — Z01419 Encounter for gynecological examination (general) (routine) without abnormal findings: Secondary | ICD-10-CM | POA: Diagnosis not present

## 2022-11-27 DIAGNOSIS — Z1231 Encounter for screening mammogram for malignant neoplasm of breast: Secondary | ICD-10-CM | POA: Diagnosis not present

## 2022-11-29 ENCOUNTER — Telehealth: Payer: Self-pay

## 2022-11-29 NOTE — Telephone Encounter (Signed)
Patient contacted office to inquire about results of mammogram, results were read to patient. KW

## 2022-12-12 DIAGNOSIS — M542 Cervicalgia: Secondary | ICD-10-CM | POA: Diagnosis not present

## 2022-12-19 DIAGNOSIS — F4312 Post-traumatic stress disorder, chronic: Secondary | ICD-10-CM | POA: Diagnosis not present

## 2023-01-02 DIAGNOSIS — M542 Cervicalgia: Secondary | ICD-10-CM | POA: Diagnosis not present

## 2023-01-08 DIAGNOSIS — Z85118 Personal history of other malignant neoplasm of bronchus and lung: Secondary | ICD-10-CM | POA: Diagnosis not present

## 2023-01-08 DIAGNOSIS — R911 Solitary pulmonary nodule: Secondary | ICD-10-CM | POA: Diagnosis not present

## 2023-01-08 DIAGNOSIS — R918 Other nonspecific abnormal finding of lung field: Secondary | ICD-10-CM | POA: Diagnosis not present

## 2023-01-09 DIAGNOSIS — F4312 Post-traumatic stress disorder, chronic: Secondary | ICD-10-CM | POA: Diagnosis not present

## 2023-01-10 DIAGNOSIS — M542 Cervicalgia: Secondary | ICD-10-CM | POA: Diagnosis not present

## 2023-01-12 NOTE — Patient Instructions (Signed)
Preventive Care 57-57 Years Old, Female Preventive care refers to lifestyle choices and visits with your health care provider that can promote health and wellness. Preventive care visits are also called wellness exams. What can I expect for my preventive care visit? Counseling Your health care provider may ask you questions about your: Medical history, including: Past medical problems. Family medical history. Pregnancy history. Current health, including: Menstrual cycle. Method of birth control. Emotional well-being. Home life and relationship well-being. Sexual activity and sexual health. Lifestyle, including: Alcohol, nicotine or tobacco, and drug use. Access to firearms. Diet, exercise, and sleep habits. Work and work environment. Sunscreen use. Safety issues such as seatbelt and bike helmet use. Physical exam Your health care provider will check your: Height and weight. These may be used to calculate your BMI (body mass index). BMI is a measurement that tells if you are at a healthy weight. Waist circumference. This measures the distance around your waistline. This measurement also tells if you are at a healthy weight and may help predict your risk of certain diseases, such as type 2 diabetes and high blood pressure. Heart rate and blood pressure. Body temperature. Skin for abnormal spots. What immunizations do I need?  Vaccines are usually given at various ages, according to a schedule. Your health care provider will recommend vaccines for you based on your age, medical history, and lifestyle or other factors, such as travel or where you work. What tests do I need? Screening Your health care provider may recommend screening tests for certain conditions. This may include: Lipid and cholesterol levels. Diabetes screening. This is done by checking your blood sugar (glucose) after you have not eaten for a while (fasting). Pelvic exam and Pap test. Hepatitis B test. Hepatitis C  test. HIV (human immunodeficiency virus) test. STI (sexually transmitted infection) testing, if you are at risk. Lung cancer screening. Colorectal cancer screening. Mammogram. Talk with your health care provider about when you should start having regular mammograms. This may depend on whether you have a family history of breast cancer. BRCA-related cancer screening. This may be done if you have a family history of breast, ovarian, tubal, or peritoneal cancers. Bone density scan. This is done to screen for osteoporosis. Talk with your health care provider about your test results, treatment options, and if necessary, the need for more tests. Follow these instructions at home: Eating and drinking  Eat a diet that includes fresh fruits and vegetables, whole grains, lean protein, and low-fat dairy products. Take vitamin and mineral supplements as recommended by your health care provider. Do not drink alcohol if: Your health care provider tells you not to drink. You are pregnant, may be pregnant, or are planning to become pregnant. If you drink alcohol: Limit how much you have to 0-1 drink a day. Know how much alcohol is in your drink. In the [place] one drink equals one 12 oz bottle of beer (355 mL), one 5 oz glass of wine (148 mL), or one 1 oz glass of hard liquor (44 mL). Lifestyle Brush your teeth every morning and night with fluoride toothpaste. Floss one time each day. Exercise for at least 30 minutes 5 or more days each week. Do not use any products that contain nicotine or tobacco. These products include cigarettes, chewing tobacco, and vaping devices, such as e-cigarettes. If you need help quitting, ask your health care provider. Do not use drugs. If you are sexually active, practice safe sex. Use a condom or other form of protection to   prevent STIs. If you do not wish to become pregnant, use a form of birth control. If you plan to become pregnant, see your health care provider for a  prepregnancy visit. Take aspirin only as told by your health care provider. Make sure that you understand how much to take and what form to take. Work with your health care provider to find out whether it is safe and beneficial for you to take aspirin daily. Find healthy ways to manage stress, such as: Meditation, yoga, or listening to music. Journaling. Talking to a trusted person. Spending time with friends and family. Minimize exposure to UV radiation to reduce your risk of skin cancer. Safety Always wear your seat belt while driving or riding in a vehicle. Do not drive: If you have been drinking alcohol. Do not ride with someone who has been drinking. When you are tired or distracted. While texting. If you have been using any mind-altering substances or drugs. Wear a helmet and other protective equipment during sports activities. If you have firearms in your house, make sure you follow all gun safety procedures. Seek help if you have been physically or sexually abused. What's next? Visit your health care provider once a year for an annual wellness visit. Ask your health care provider how often you should have your eyes and teeth checked. Stay up to date on all vaccines. This information is not intended to replace advice given to you by your health care provider. Make sure you discuss any questions you have with your health care provider. Document Revised: 03/09/2021 Document Reviewed: 03/09/2021 Elsevier Patient Education  2023 [employer] Breast Self-Awareness Breast self-awareness is knowing how your breasts look and feel. You need to: Check your breasts on a regular basis. Tell your doctor about any changes. Become familiar with the look and feel of your breasts. This can help you catch a breast problem while it is still small and can be treated. You should do breast self-exams even if you have breast implants. What you need: A mirror. A well-lit room. A pillow or other  soft object. How to do a breast self-exam Follow these steps to do a breast self-exam: Look for changes  Take off all the clothes above your waist. Stand in front of a mirror in a room with good lighting. Put your hands down at your sides. Compare your breasts in the mirror. Look for any difference between them, such as: A difference in shape. A difference in size. Wrinkles, dips, and bumps in one breast and not the other. Look at each breast for changes in the skin, such as: Redness. Scaly areas. Skin that has gotten thicker. Dimpling. Open sores (ulcers). Look for changes in your nipples, such as: Fluid coming out of a nipple. Fluid around a nipple. Bleeding. Dimpling. Redness. A nipple that looks pushed in (retracted), or that has changed position. Feel for changes Lie on your back. Feel each breast. To do this: Pick a breast to feel. Place a pillow under the shoulder closest to that breast. Put the arm closest to that breast behind your head. Feel the nipple area of that breast using the hand of your other arm. Feel the area with the pads of your three middle fingers by making small circles with your fingers. Use light, medium, and firm pressure. Continue the overlapping circles, moving downward over the breast. Keep making circles with your fingers. Stop when you feel your ribs. Start making circles with your fingers again, this time going   upward until you reach your collarbone. Then, make circles outward across your breast and into your armpit area. Squeeze your nipple. Check for discharge and lumps. Repeat these steps to check your other breast. Sit or stand in the tub or shower. With soapy water on your skin, feel each breast the same way you did when you were lying down. Write down what you find Writing down what you find can help you remember what to tell your doctor. Write down: What is normal for each breast. Any changes you find in each breast. These  include: The kind of changes you find. A tender or painful breast. Any lump you find. Write down its size and where it is. When you last had your monthly period (menstrual cycle). General tips If you are breastfeeding, the best time to check your breasts is after you feed your baby or after you use a breast pump. If you get monthly bleeding, the best time to check your breasts is 5-7 days after your monthly cycle ends. With time, you will become comfortable with the self-exam. You will also start to know if there are changes in your breasts. Contact a doctor if: You see a change in the shape or size of your breasts or nipples. You see a change in the skin of your breast or nipples, such as red or scaly skin. You have fluid coming from your nipples that is not normal. You find a new lump or thick area. You have breast pain. You have any concerns about your breast health. Summary Breast self-awareness includes looking for changes in your breasts and feeling for changes within your breasts. You should do breast self-awareness in front of a mirror in a well-lit room. If you get monthly periods (menstrual cycles), the best time to check your breasts is 5-7 days after your period ends. Tell your doctor about any changes you see in your breasts. Changes include changes in size, changes on the skin, painful or tender breasts, or fluid from your nipples that is not normal. This information is not intended to replace advice given to you by your health care provider. Make sure you discuss any questions you have with your health care provider. Document Revised: 02/16/2022 Document Reviewed: 07/14/2021 Elsevier Patient Education  2023 Elsevier Inc.  

## 2023-01-12 NOTE — Progress Notes (Unsigned)
ANNUAL PREVENTATIVE CARE GYNECOLOGY  ENCOUNTER NOTE  Subjective:       Emma Ayers is a 57 y.o. [id number] female here for a routine annual gynecologic exam. She has a PMH of fibroid uterus, thyroid cancer s/p ablation. The patient is sexually active. The patient is not taking hormone replacement therapy. Patient denies post-menopausal vaginal bleeding. The patient wears seatbelts: yes. The patient participates in regular exercise: yes. Has the patient ever been transfused or tattooed?: no. The patient reports that there is not domestic violence in her life.    Current complaints: 1.  ***    Gynecologic History No LMP recorded. Patient is postmenopausal. Contraception: post menopausal status Last Pap: 01/27/2020. Results were: normal Last mammogram: 11/27/2022. Results were: normal Last Colonoscopy: 12/24/2017: 10 years Last Dexa Scan: Never done   Obstetric History OB History  Gravida Para Term Preterm AB Living  0 0 2  SAB IAB Ectopic Multiple Live Births  0 0 0   2    # Outcome Date GA Lbr Len/2nd Weight Sex Delivery Anes PTL Lv  2 Term 12/08/04   8 lb 7 oz (3.827 kg) F Vag-Spont  N LIV  1 Term 01/27/99   9 lb 3 oz (4.167 kg) F Vag-Spont  N LIV    Past Medical History:  Diagnosis Date   Anxiety    Cancer (HCC)    thyroid and lung   Depression    Deviated septum    GERD (gastroesophageal reflux disease)    H/o Lyme disease    X2   History of West Nile virus (WNV) infection    Hypertension    Migraine    Ovarian cyst    Thyroid disease    Uterine fibroid     Family History  Problem Relation Age of Onset   Heart failure Mother    Breast cancer Mother 32   Heart disease Mother    Depression Mother    Heart attack Father    Prostate cancer Father    Anxiety disorder Father    Heart disease Father    Drug abuse Sister    Colon polyps Sister    Depression Sister    Hypothyroidism Sister    Heart disease Brother    Depression Brother     Hypothyroidism Brother    Breast cancer Maternal Aunt    Breast cancer Maternal Aunt     Past Surgical History:  Procedure Laterality Date   radioactive iodine  2009   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   uterine ablation  2011    Social History   Socioeconomic History   Marital status: Married    Spouse name: Not on file   Number of children: 2   Years of education: Not on file   Highest education level: Not on file  Occupational History   Not on file  Tobacco Use   Smoking status: Never   Smokeless tobacco: Never  Vaping Use   Vaping Use: Never used  Substance and Sexual Activity   Alcohol use: Yes    Alcohol/week: 1.0 standard drink of alcohol    Types: 1 Standard drinks or equivalent per week   Drug use: Yes    Frequency: 7.0 times per week    Types: Marijuana    Comment: sometimes at bedtime   Sexual activity: Not Currently    Birth control/protection: Surgical  Other Topics Concern   Not on file  Social History Narrative   **  Merged History Encounter **       Social Determinants of Health   Financial Resource Strain: Not on file  Food Insecurity: Not on file  Transportation Needs: Not on file  Physical Activity: Not on file  Stress: Not on file  Social Connections: Not on file  Intimate Partner Violence: Not on file    Current Outpatient Medications on File Prior to Visit  Medication Sig Dispense Refill   Cholecalciferol 50 MCG (2000 UT) TABS Take by mouth. As needed     FLUoxetine (PROZAC) 20 MG capsule Take 20 mg by mouth daily.     Levothyroxine Sodium 112 MCG CAPS Take 25 mcg by mouth daily before breakfast. Alternates between 112 and 125 based on weight.     LORazepam (ATIVAN) 1 MG tablet Take 1 tablet (1 mg total) by mouth every 8 (eight) hours. 0.5-1 tablets as needed for anxiety 30 tablet 0   Multiple Vitamin (MULTIVITAMIN) capsule Take 1 capsule by mouth daily.     ondansetron (ZOFRAN) 8 MG tablet Take 0.5 tablets (4 mg total)  by mouth every 8 (eight) hours as needed for nausea or vomiting. 30 tablet 0   SUMAtriptan (IMITREX) 6 MG/0.5ML SOSY injection Inject 0.5 mLs (6 mg total) into the skin every 2 (two) hours as needed for migraine or headache. F 9 mL 0   SYNTHROID 125 MCG tablet Take 125 mcg by mouth daily. Pt takes     No current facility-administered medications on file prior to visit.    Allergies  Allergen Reactions   Bee Venom Anaphylaxis    Has epi pen   Benzocaine Nausea And Vomiting   Codeine Nausea Only   Latex Rash   Tape Rash      Review of Systems ROS Review of Systems - General ROS: negative for - chills, fatigue, fever, hot flashes, night sweats, weight gain or weight loss Psychological ROS: negative for - anxiety, decreased libido, depression, mood swings, physical abuse or sexual abuse Ophthalmic ROS: negative for - blurry vision, eye pain or loss of vision ENT ROS: negative for - headaches, hearing change, visual changes or vocal changes Allergy and Immunology ROS: negative for - hives, itchy/watery eyes or seasonal allergies Hematological and Lymphatic ROS: negative for - bleeding problems, bruising, swollen lymph nodes or weight loss Endocrine ROS: negative for - galactorrhea, hair pattern changes, hot flashes, malaise/lethargy, mood swings, palpitations, polydipsia/polyuria, skin changes, temperature intolerance or unexpected weight changes Breast ROS: negative for - new or changing breast lumps or nipple discharge Respiratory ROS: negative for - cough or shortness of breath Cardiovascular ROS: negative for - chest pain, irregular heartbeat, palpitations or shortness of breath Gastrointestinal ROS: no abdominal pain, change in bowel habits, or black or bloody stools Genito-Urinary ROS: no dysuria, trouble voiding, or hematuria Musculoskeletal ROS: negative for - joint pain or joint stiffness Neurological ROS: negative for - bowel and bladder control changes Dermatological  ROS: negative for rash and skin lesion changes   Objective:   There were no vitals taken for this visit. CONSTITUTIONAL: Well-developed, well-nourished female in no acute distress.  PSYCHIATRIC: Normal mood and affect. Normal behavior. Normal judgment and thought content. NEUROLGIC: Alert and oriented to person, place, and time. Normal muscle tone coordination. No cranial nerve deficit noted. HENT:  Normocephalic, atraumatic, External right and left ear normal. Oropharynx is clear and moist EYES: Conjunctivae and EOM are normal. Pupils are equal, round, and reactive to light. No scleral icterus.  NECK: Normal range of motion,  supple, no masses.  Normal thyroid.  SKIN: Skin is warm and dry. No rash noted. Not diaphoretic. No erythema. No pallor. CARDIOVASCULAR: Normal heart rate noted, regular rhythm, no murmur. RESPIRATORY: Clear to auscultation bilaterally. Effort and breath sounds normal, no problems with respiration noted. BREASTS: Symmetric in size. No masses, skin changes, nipple drainage, or lymphadenopathy. ABDOMEN: Soft, normal bowel sounds, no distention noted.  No tenderness, rebound or guarding.  BLADDER: Normal PELVIC:  Bladder {:311640}  Urethra: {:311719}  Vulva: {:311722}  Vagina: {:311643}  Cervix: {:311644}  Uterus: {:311718}  Adnexa: {:311645}  RV: {Blank multiple:19196::"External Exam NormaI","No Rectal Masses","Normal Sphincter tone"}  MUSCULOSKELETAL: Normal range of motion. No tenderness.  No cyanosis, clubbing, or edema.  2+ distal pulses. LYMPHATIC: No Axillary, Supraclavicular, or Inguinal Adenopathy.   Labs: Lab Results  Component Value Date   WBC 4.7 10/25/2022   HGB 13.5 10/25/2022   HCT 39.3 10/25/2022   MCV 83 10/25/2022   PLT 298 10/25/2022    Lab Results  Component Value Date   CREATININE 0.72 10/25/2022   BUN 13 10/25/2022   NA 139 10/25/2022   K 4.0 10/25/2022   CL 101 10/25/2022   CO2 22 10/25/2022    Lab Results  Component Value  Date   ALT 17 10/25/2022   AST 14 10/25/2022   ALKPHOS 65 10/25/2022   BILITOT 0.5 10/25/2022    Lab Results  Component Value Date   CHOL 182 10/25/2022   HDL 68 10/25/2022   LDLCALC 105 (H) 10/25/2022   TRIG 43 10/25/2022   CHOLHDL 2.7 10/25/2022    Lab Results  Component Value Date   TSH 4.770 (H) 12/29/2019    Lab Results  Component Value Date   HGBA1C 5.6 10/25/2022     Assessment:   1. Encounter for well woman exam with routine gynecological exam      Plan:  Pap:  UTD Mammogram:  UTD Colon Screening:   UTD Labs:  UTD by PCP Routine preventative health maintenance measures emphasized: {Blank multiple:19196::"Exercise/Diet/Weight control","Tobacco Warnings","Alcohol/Substance use risks","Stress Management","Peer Pressure Issues","Safe Sex"} COVID Vaccination status: Return to Clinic - 1 Year   Hildred Laser, MD [facility]

## 2023-01-16 ENCOUNTER — Ambulatory Visit (INDEPENDENT_AMBULATORY_CARE_PROVIDER_SITE_OTHER): Payer: BC Managed Care – PPO | Admitting: Obstetrics and Gynecology

## 2023-01-16 ENCOUNTER — Encounter: Payer: Self-pay | Admitting: Obstetrics and Gynecology

## 2023-01-16 VITALS — BP 99/67 | HR 63 | Resp 16 | Ht 68.5 in | Wt 161.7 lb

## 2023-01-16 DIAGNOSIS — Z01419 Encounter for gynecological examination (general) (routine) without abnormal findings: Secondary | ICD-10-CM | POA: Diagnosis not present

## 2023-01-16 DIAGNOSIS — N951 Menopausal and female climacteric states: Secondary | ICD-10-CM

## 2023-01-16 DIAGNOSIS — Z85850 Personal history of malignant neoplasm of thyroid: Secondary | ICD-10-CM

## 2023-01-16 DIAGNOSIS — D259 Leiomyoma of uterus, unspecified: Secondary | ICD-10-CM

## 2023-01-17 DIAGNOSIS — M542 Cervicalgia: Secondary | ICD-10-CM | POA: Diagnosis not present

## 2023-01-30 DIAGNOSIS — F4312 Post-traumatic stress disorder, chronic: Secondary | ICD-10-CM | POA: Diagnosis not present

## 2023-02-01 ENCOUNTER — Other Ambulatory Visit: Payer: BC Managed Care – PPO

## 2023-02-20 DIAGNOSIS — F4312 Post-traumatic stress disorder, chronic: Secondary | ICD-10-CM | POA: Diagnosis not present

## 2023-03-23 DIAGNOSIS — S0006XA Insect bite (nonvenomous) of scalp, initial encounter: Secondary | ICD-10-CM | POA: Diagnosis not present

## 2023-03-23 DIAGNOSIS — W57XXXA Bitten or stung by nonvenomous insect and other nonvenomous arthropods, initial encounter: Secondary | ICD-10-CM | POA: Diagnosis not present

## 2023-03-27 DIAGNOSIS — F4312 Post-traumatic stress disorder, chronic: Secondary | ICD-10-CM | POA: Diagnosis not present

## 2023-04-03 DIAGNOSIS — F4312 Post-traumatic stress disorder, chronic: Secondary | ICD-10-CM | POA: Diagnosis not present

## 2023-05-01 DIAGNOSIS — F4312 Post-traumatic stress disorder, chronic: Secondary | ICD-10-CM | POA: Diagnosis not present

## 2023-05-07 DIAGNOSIS — E890 Postprocedural hypothyroidism: Secondary | ICD-10-CM | POA: Diagnosis not present

## 2023-05-08 DIAGNOSIS — E890 Postprocedural hypothyroidism: Secondary | ICD-10-CM | POA: Diagnosis not present

## 2023-05-08 DIAGNOSIS — J342 Deviated nasal septum: Secondary | ICD-10-CM | POA: Diagnosis not present

## 2023-05-08 DIAGNOSIS — Z85850 Personal history of malignant neoplasm of thyroid: Secondary | ICD-10-CM | POA: Diagnosis not present

## 2023-05-11 DIAGNOSIS — E209 Hypoparathyroidism, unspecified: Secondary | ICD-10-CM | POA: Diagnosis not present

## 2023-05-21 DIAGNOSIS — F4312 Post-traumatic stress disorder, chronic: Secondary | ICD-10-CM | POA: Diagnosis not present

## 2023-06-12 DIAGNOSIS — F4312 Post-traumatic stress disorder, chronic: Secondary | ICD-10-CM | POA: Diagnosis not present

## 2023-07-11 DIAGNOSIS — F4312 Post-traumatic stress disorder, chronic: Secondary | ICD-10-CM | POA: Diagnosis not present

## 2023-08-20 DIAGNOSIS — F4312 Post-traumatic stress disorder, chronic: Secondary | ICD-10-CM | POA: Diagnosis not present

## 2023-09-12 DIAGNOSIS — F4312 Post-traumatic stress disorder, chronic: Secondary | ICD-10-CM | POA: Diagnosis not present

## 2023-10-11 DIAGNOSIS — F4312 Post-traumatic stress disorder, chronic: Secondary | ICD-10-CM | POA: Diagnosis not present

## 2023-10-23 DIAGNOSIS — Z808 Family history of malignant neoplasm of other organs or systems: Secondary | ICD-10-CM | POA: Diagnosis not present

## 2023-10-23 DIAGNOSIS — Z86018 Personal history of other benign neoplasm: Secondary | ICD-10-CM | POA: Diagnosis not present

## 2023-10-23 DIAGNOSIS — L578 Other skin changes due to chronic exposure to nonionizing radiation: Secondary | ICD-10-CM | POA: Diagnosis not present

## 2023-10-23 DIAGNOSIS — Z872 Personal history of diseases of the skin and subcutaneous tissue: Secondary | ICD-10-CM | POA: Diagnosis not present

## 2023-10-31 DIAGNOSIS — F4312 Post-traumatic stress disorder, chronic: Secondary | ICD-10-CM | POA: Diagnosis not present

## 2023-11-16 ENCOUNTER — Telehealth: Payer: Self-pay | Admitting: Internal Medicine

## 2023-11-16 NOTE — Telephone Encounter (Signed)
Left voicemail to set up appointment 

## 2023-11-16 NOTE — Telephone Encounter (Unsigned)
 Copied from CRM [id number]. Topic: Referral - Request for Referral >> Nov 16, 2023  8:47 AM Payton Doughty wrote: Did the patient discuss referral with their provider in the last year? No (If No - schedule appointment) (If Yes - send message)  Appointment offered? No  Type of order/referral and detailed reason for visit: pt states she has cyst on the tendon hood of her finger.  Pt feels like it may need to come out. She said she feels it may need minor surgery and she may need to see an orthopedic surgeon.  Pt declined appt.  Preference of office, provider, location: orthopedicc surgeon  If referral order, have you been seen by this specialty before? No (If Yes, this issue or another issue? When? Where?  Can we respond through MyChart? Yes

## 2023-11-22 DIAGNOSIS — F4312 Post-traumatic stress disorder, chronic: Secondary | ICD-10-CM | POA: Diagnosis not present

## 2023-12-03 DIAGNOSIS — E890 Postprocedural hypothyroidism: Secondary | ICD-10-CM | POA: Diagnosis not present

## 2023-12-10 DIAGNOSIS — Z85850 Personal history of malignant neoplasm of thyroid: Secondary | ICD-10-CM | POA: Diagnosis not present

## 2023-12-10 DIAGNOSIS — E890 Postprocedural hypothyroidism: Secondary | ICD-10-CM | POA: Diagnosis not present

## 2023-12-14 DIAGNOSIS — F4312 Post-traumatic stress disorder, chronic: Secondary | ICD-10-CM | POA: Diagnosis not present

## 2024-01-17 DIAGNOSIS — F4312 Post-traumatic stress disorder, chronic: Secondary | ICD-10-CM | POA: Diagnosis not present

## 2024-02-28 DIAGNOSIS — F4312 Post-traumatic stress disorder, chronic: Secondary | ICD-10-CM | POA: Diagnosis not present

## 2024-03-18 DIAGNOSIS — F4312 Post-traumatic stress disorder, chronic: Secondary | ICD-10-CM | POA: Diagnosis not present

## 2024-03-26 DIAGNOSIS — F4312 Post-traumatic stress disorder, chronic: Secondary | ICD-10-CM | POA: Diagnosis not present

## 2024-04-15 DIAGNOSIS — F4312 Post-traumatic stress disorder, chronic: Secondary | ICD-10-CM | POA: Diagnosis not present

## 2024-04-17 DIAGNOSIS — J069 Acute upper respiratory infection, unspecified: Secondary | ICD-10-CM | POA: Diagnosis not present

## 2024-04-17 DIAGNOSIS — R051 Acute cough: Secondary | ICD-10-CM | POA: Diagnosis not present

## 2024-04-17 DIAGNOSIS — R0982 Postnasal drip: Secondary | ICD-10-CM | POA: Diagnosis not present

## 2024-04-17 DIAGNOSIS — T732XXA Exhaustion due to exposure, initial encounter: Secondary | ICD-10-CM | POA: Diagnosis not present

## 2024-05-08 DIAGNOSIS — F4312 Post-traumatic stress disorder, chronic: Secondary | ICD-10-CM | POA: Diagnosis not present

## 2024-06-05 DIAGNOSIS — F4312 Post-traumatic stress disorder, chronic: Secondary | ICD-10-CM | POA: Diagnosis not present

## 2024-07-31 DIAGNOSIS — F4312 Post-traumatic stress disorder, chronic: Secondary | ICD-10-CM | POA: Diagnosis not present

## 2024-08-28 DIAGNOSIS — F4312 Post-traumatic stress disorder, chronic: Secondary | ICD-10-CM | POA: Diagnosis not present

## 2024-09-11 DIAGNOSIS — F4312 Post-traumatic stress disorder, chronic: Secondary | ICD-10-CM | POA: Diagnosis not present
